# Patient Record
Sex: Female | Born: 1955 | Race: White | Hispanic: No | Marital: Married | Smoking: Current every day smoker
Health system: Southern US, Community
[De-identification: ages and names within clinical notes are randomized; demographics above are authoritative.]

## PROBLEM LIST (undated history)

## (undated) DIAGNOSIS — E119 Type 2 diabetes mellitus without complications: Secondary | ICD-10-CM

## (undated) DIAGNOSIS — Z5189 Encounter for other specified aftercare: Secondary | ICD-10-CM

## (undated) DIAGNOSIS — H269 Unspecified cataract: Secondary | ICD-10-CM

## (undated) DIAGNOSIS — E785 Hyperlipidemia, unspecified: Secondary | ICD-10-CM

## (undated) DIAGNOSIS — Z8049 Family history of malignant neoplasm of other genital organs: Secondary | ICD-10-CM

## (undated) DIAGNOSIS — K922 Gastrointestinal hemorrhage, unspecified: Secondary | ICD-10-CM

## (undated) DIAGNOSIS — M1990 Unspecified osteoarthritis, unspecified site: Secondary | ICD-10-CM

## (undated) DIAGNOSIS — Z803 Family history of malignant neoplasm of breast: Secondary | ICD-10-CM

## (undated) HISTORY — PX: ABDOMINAL HYSTERECTOMY: SHX81

## (undated) HISTORY — PX: DIAGNOSTIC LAPAROSCOPY: SUR761

## (undated) HISTORY — DX: Hyperlipidemia, unspecified: E78.5

## (undated) HISTORY — DX: Family history of malignant neoplasm of breast: Z80.3

## (undated) HISTORY — DX: Unspecified cataract: H26.9

## (undated) HISTORY — PX: COLONOSCOPY: SHX174

## (undated) HISTORY — DX: Family history of malignant neoplasm of other genital organs: Z80.49

## (undated) HISTORY — DX: Gastrointestinal hemorrhage, unspecified: K92.2

## (undated) HISTORY — DX: Encounter for other specified aftercare: Z51.89

## (undated) HISTORY — DX: Unspecified osteoarthritis, unspecified site: M19.90

## (undated) HISTORY — DX: Type 2 diabetes mellitus without complications: E11.9

---
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
## 1977-09-21 HISTORY — PX: TONSILLECTOMY: SUR1361

## 1998-02-21 HISTORY — PX: EYE SURGERY: SHX253

## 2008-06-14 HISTORY — PX: LAPAROSCOPIC SUPRACERVICAL HYSTERECTOMY: SUR797

## 2011-12-22 LAB — HM MAMMOGRAPHY

## 2015-09-13 DIAGNOSIS — K922 Gastrointestinal hemorrhage, unspecified: Secondary | ICD-10-CM

## 2015-09-13 HISTORY — DX: Gastrointestinal hemorrhage, unspecified: K92.2

## 2015-10-03 LAB — HM COLONOSCOPY

## 2015-10-11 DIAGNOSIS — K922 Gastrointestinal hemorrhage, unspecified: Secondary | ICD-10-CM | POA: Insufficient documentation

## 2015-10-11 DIAGNOSIS — E119 Type 2 diabetes mellitus without complications: Secondary | ICD-10-CM

## 2015-10-11 HISTORY — DX: Type 2 diabetes mellitus without complications: E11.9

## 2015-10-11 LAB — CBC AND DIFFERENTIAL
HCT: 31 — AB (ref 36–46)
HEMOGLOBIN: 11 — AB (ref 12.0–16.0)
Platelets: 144 — AB (ref 150–399)
WBC: 6.8

## 2015-10-11 LAB — BASIC METABOLIC PANEL
BUN: 22 — AB (ref 4–21)
Creatinine: 0.7 (ref 0.5–1.1)
GLUCOSE: 160
POTASSIUM: 4.1 (ref 3.4–5.3)
SODIUM: 137 (ref 137–147)

## 2015-10-11 LAB — HEPATIC FUNCTION PANEL
ALT: 21 (ref 7–35)
AST: 24 (ref 13–35)
Alkaline Phosphatase: 61 (ref 25–125)

## 2017-03-23 ENCOUNTER — Ambulatory Visit (INDEPENDENT_AMBULATORY_CARE_PROVIDER_SITE_OTHER): Payer: PRIVATE HEALTH INSURANCE | Admitting: Family Medicine

## 2017-03-23 ENCOUNTER — Encounter: Payer: Self-pay | Admitting: Family Medicine

## 2017-03-23 VITALS — BP 110/76 | HR 76 | Temp 97.8°F | Resp 16 | Ht 65.0 in | Wt 146.0 lb

## 2017-03-23 DIAGNOSIS — Z114 Encounter for screening for human immunodeficiency virus [HIV]: Secondary | ICD-10-CM

## 2017-03-23 DIAGNOSIS — Z7689 Persons encountering health services in other specified circumstances: Secondary | ICD-10-CM

## 2017-03-23 DIAGNOSIS — Z8719 Personal history of other diseases of the digestive system: Secondary | ICD-10-CM

## 2017-03-23 DIAGNOSIS — E119 Type 2 diabetes mellitus without complications: Secondary | ICD-10-CM | POA: Diagnosis not present

## 2017-03-23 DIAGNOSIS — K635 Polyp of colon: Secondary | ICD-10-CM

## 2017-03-23 DIAGNOSIS — E785 Hyperlipidemia, unspecified: Secondary | ICD-10-CM | POA: Insufficient documentation

## 2017-03-23 DIAGNOSIS — Z23 Encounter for immunization: Secondary | ICD-10-CM | POA: Diagnosis not present

## 2017-03-23 DIAGNOSIS — Z720 Tobacco use: Secondary | ICD-10-CM

## 2017-03-23 DIAGNOSIS — Z1159 Encounter for screening for other viral diseases: Secondary | ICD-10-CM

## 2017-03-23 LAB — POCT UA - MICROALBUMIN: Microalbumin Ur, POC: 0 mg/L

## 2017-03-23 NOTE — Assessment & Plan Note (Signed)
Control unknown - last A1c not available Check A1c Continue metformin currently If A1c is >7, would add Jardiance vs Trulicity Foot exam completed today Patient has upcoming eye exam She will check with her insurance about pneumococcal vaccine Flu vaccine given today Follow-up in 3 months

## 2017-03-23 NOTE — Assessment & Plan Note (Signed)
Patient denies any melena, hematochezia, hematemesis currently From patient's description, it sounds as though this was a procedure complication Given her history of colon polyps, she will need repeat colonoscopy in 2020 Check CBC today

## 2017-03-23 NOTE — Progress Notes (Signed)
Patient: Denise Torres Female    DOB: [birth year]   61 y.o.   MRN: [id number] Visit Date: 03/23/2017  Today's Provider: [name], MD   Chief Complaint  Patient presents with  . Establish Care   Subjective:    HPI   Denise Torres presents to establish care. She has a H/O diabetes mellitis type 2, hypercholesterolemia. Her previous PCP was Lonni Fix at [facility], but she has not seen a doctor in over one year due to losing job, Psychologist, clinical, etc. She states she is due for a mammogram (never had abnormal mammogram per pt). She states her last Tdap was given 3 years ago when her grandchild was born. Her last colonoscopy was 10/03/2015, and this is due to be repeated in 2020. She had a partial hysterectomy in 2000, due to fibroid tumors that caused menorrhagia. She has never had an abnormal pap. She believes she still has her cervix.  H/o lower GI bleed: During last screening colonoscopy, had clip placed on larger polyp that fell off.  Had to be admitted for blood transfusions  OA: mostly in b/l small fingers and L knee.  Notes that previous XRays showed degenerative changes in L knee.  Has had cortisone injections in the past in L hand.  T2DM:   Taking Metformin 1000mg  BID, no side effects from medication.  Unsure of last A1c.  Has eye exam scheduled in 2 wks.  Thinks she has never had foot exam or urine microalbumin.  Unsure if she has had pneumococcal vaccines.  HLD: Taking simvastatin 10mg  daily.  Admits that she forgets doses at times.  No side effects  Tobacco abuse: Tried to quit previously by tapering down.  Husband quit smoking.  States that she felt like she was going to have a meltdown when she quit.  Thinks she is 80% ready to quit smoking currently.  It is not affecting her relationship.  She knows about the cancer, lung disease, and CVD risks.   No Known Allergies   Current Outpatient Prescriptions:  .  metFORMIN (GLUCOPHAGE) 500 MG tablet,  Take 1,000 mg by mouth 2 (two) times daily., Disp: , Rfl: 0 .  simvastatin (ZOCOR) 10 MG tablet, Take 10 mg by mouth every evening., Disp: , Rfl: 0  Review of Systems  Constitutional: Negative.   HENT: Negative.   Eyes: Negative.   Respiratory: Negative.   Cardiovascular: Negative.   Gastrointestinal: Negative.   Endocrine: Negative.   Genitourinary: Negative.   Musculoskeletal: Positive for arthralgias. Negative for back pain, gait problem, joint swelling, myalgias, neck pain and neck stiffness.  Skin: Negative.   Allergic/Immunologic: Positive for environmental allergies. Negative for food allergies and immunocompromised state.  Neurological: Positive for headaches. Negative for dizziness, tremors, seizures, syncope, facial asymmetry, speech difficulty, weakness, light-headedness and numbness.  Hematological: Negative.   Psychiatric/Behavioral: Negative.    Past Medical History:  Diagnosis Date  . Arthritis    mostly in b/l hands, left knee  . Blood transfusion without reported diagnosis   . Diabetes mellitus without complication ([place])   . Hyperlipidemia   . Lower GI bleed 09/2015   Past Surgical History:  Procedure Laterality Date  . EYE SURGERY Bilateral 02/21/1998   lasik  . LAPAROSCOPIC SUPRACERVICAL HYSTERECTOMY  2010   partial  . TONSILLECTOMY  09/21/1977  . WISDOM TOOTH EXTRACTION Bilateral 1976   Family History  Problem Relation Age of Onset  . Breast cancer Mother 40  s/p masectomy  . Diabetes Mother   . Atrial fibrillation Mother   . Diabetes Father   . Heart attack Father 48  . Rheum arthritis Brother   . Alcohol abuse Brother   . Drug abuse Brother     Social History  Substance Use Topics  . Smoking status: Current Every Day Smoker    Packs/day: 0.25    Years: 10.00    Types: Cigarettes  . Smokeless tobacco: Never Used     Comment: started smoking around 2008  . Alcohol use No     Comment: rarely   Objective:   BP 110/76 (BP Location:  Right Arm, Patient Position: Sitting, Cuff Size: Large)   Pulse 76   Temp 97.8 F (36.6 C) (Oral)   Resp 16   Ht 5\' 5"  (1.651 m)   Wt 146 lb (66.2 kg)   BMI 24.30 kg/m  Vitals:   03/23/17 1400  BP: 110/76  Pulse: 76  Resp: 16  Temp: 97.8 F (36.6 C)  TempSrc: Oral  Weight: 146 lb (66.2 kg)  Height: 5\' 5"  (1.651 m)     Physical Exam  Constitutional: She is oriented to person, place, and time. She appears well-developed and well-nourished. No distress.  HENT:  Head: Normocephalic and atraumatic.  Right Ear: External ear normal.  Left Ear: External ear normal.  Nose: Nose normal.  Mouth/Throat: Oropharynx is clear and moist.  Eyes: Conjunctivae are normal. No scleral icterus.  Neck: Neck supple. No thyromegaly present.  Cardiovascular: Normal rate, regular rhythm, normal heart sounds and intact distal pulses.   No murmur heard. Pulmonary/Chest: Breath sounds normal. No respiratory distress. She has no wheezes. She has no rales.  Abdominal: Soft. Bowel sounds are normal. She exhibits no distension. There is no tenderness. There is no rebound and no guarding.  Musculoskeletal: She exhibits no edema or deformity.  Lymphadenopathy:    She has no cervical adenopathy.  Neurological: She is alert and oriented to person, place, and time.  Skin: Skin is warm and dry. 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  Psychiatric: She has a normal mood and affect. Her behavior is normal.  Vitals reviewed.   Diabetic Foot Exam - Simple   Simple Foot Form Diabetic Foot exam was performed with the following findings:  Yes 03/23/2017  3:17 PM  Visual Inspection No deformities, no ulcerations, no other skin breakdown bilaterally:  Yes Sensation Testing Intact to touch and monofilament testing bilaterally:  Yes Pulse Check Posterior Tibialis and Dorsalis pulse intact bilaterally:  Yes Comments         Assessment & Plan:        Problem List Items Addressed This Visit      Digestive   Colon polyps    Next colonoscopy to be performed in 2020 for screening given history of polyps        Endocrine   Diabetes mellitus type 2, controlled, without complications ([place])    Control unknown - last A1c not available Check A1c Continue metformin currently If A1c is >7, would add Jardiance vs Trulicity Foot exam completed today Patient has upcoming eye exam She will check with her insurance about pneumococcal vaccine Flu vaccine given today Follow-up in 3 months      Relevant Medications   metFORMIN (GLUCOPHAGE) 500 MG tablet   simvastatin (ZOCOR) 10 MG tablet   Other Relevant Orders   Hemoglobin A1c   POCT UA - Microalbumin (Completed)  Other   H/O: GI bleed    Patient denies any melena, hematochezia, hematemesis currently From patient's description, it sounds as though this was a procedure complication Given her history of colon polyps, she will need repeat colonoscopy in 2020 Check CBC today      Relevant Orders   CBC w/Diff/Platelet   HLD (hyperlipidemia)    Control unknown, last lipid panel not available Continue simvastatin currently Recheck lipid panel      Relevant Medications   simvastatin (ZOCOR) 10 MG tablet   Other Relevant Orders   COMPLETE METABOLIC PANEL WITH GFR   Lipid panel   Tobacco abuse    Long discussion with the patient regarding risks of smoking She states that she is not quite ready to fully quit at this time She may be interested in medications to help her quit in the future, but not currently She states she may be willing to try to cut back on the number of cigarettes she smokes a day she has done this in the past       Other Visit Diagnoses    Encounter to establish care    -  Primary   Encounter for screening for HIV       Relevant Orders   HIV antibody (with reflex)   Need for hepatitis C screening test       Relevant Orders   Hepatitis C Antibody   Need  for immunization against influenza       Relevant Orders   Flu Vaccine QUAD 36+ mos IM (Completed)       Return in about 2 months (around 05/23/2017) for CPE.   Addressed extensive list of chronic and acute medical problems today requiring extensive time in counseling and coordination of care.  Over half of this 45 minute visit were spent in counseling and coordinating care of multiple medical problems.      The entirety of the information documented in the History of Present Illness, Review of Systems and Physical Exam were personally obtained by me. Portions of this information were initially documented by Raquel Sarna Ratchford, CMA and reviewed by me for thoroughness and accuracy.     Denise Paganini, MD  [facility]

## 2017-03-23 NOTE — Patient Instructions (Addendum)
  Diet Recommendations for Diabetes   Starchy (carb) foods include: Bread, rice, pasta, potatoes, corn, crackers, bagels, muffins, all baked goods.  (Fruits, milk, and yogurt also have carbohydrate, but most of these foods will not spike your blood sugar as the starchy foods will.)  A few fruits do cause high blood sugars; use small portions of bananas (limit to 1/2 at a time), grapes, watermelon, and most tropical fruits.    Protein foods include: Meat, fish, poultry, eggs, dairy foods, and beans such as pinto and kidney beans (beans also provide carbohydrate).   1. Eat at least 3 meals and 1-2 snacks per day. Never go more than 4-5 hours while awake without eating. Eat breakfast within the first hour of getting up.   2. Limit starchy foods to TWO per meal and ONE per snack. ONE portion of a starchy  food is equal to the following:   - ONE slice of bread (or its equivalent, such as half of a hamburger bun).   - 1/2 cup of a "scoopable" starchy food such as potatoes or rice.   - 15 grams of carbohydrate as shown on food label.  3. Include at every meal: a protein food, a carb food, and vegetables and/or fruit.   - Obtain twice as many veg's as protein or carbohydrate foods for both lunch and dinner.   - Fresh or frozen veg's are best.   - Try to keep frozen veg's on hand for a quick vegetable serving.      Check with insurance about Shingrix (shingles vaccination) and pneumococcal vaccines

## 2017-03-23 NOTE — Assessment & Plan Note (Signed)
Next colonoscopy to be performed in 2020 for screening given history of polyps

## 2017-03-23 NOTE — Assessment & Plan Note (Signed)
Long discussion with the patient regarding risks of smoking She states that she is not quite ready to fully quit at this time She may be interested in medications to help her quit in the future, but not currently She states she may be willing to try to cut back on the number of cigarettes she smokes a day she has done this in the past

## 2017-03-23 NOTE — Assessment & Plan Note (Signed)
Control unknown, last lipid panel not available Continue simvastatin currently Recheck lipid panel

## 2017-03-24 ENCOUNTER — Telehealth: Payer: Self-pay

## 2017-03-24 LAB — COMPLETE METABOLIC PANEL WITH GFR
AG Ratio: 1.9 (calc) (ref 1.0–2.5)
ALBUMIN MSPROF: 4.5 g/dL (ref 3.6–5.1)
ALKALINE PHOSPHATASE (APISO): 82 U/L (ref 33–130)
ALT: 33 U/L — ABNORMAL HIGH (ref 6–29)
AST: 22 U/L (ref 10–35)
BUN: 12 mg/dL (ref 7–25)
CO2: 28 mmol/L (ref 20–32)
CREATININE: 0.9 mg/dL (ref 0.50–1.05)
Calcium: 9.2 mg/dL (ref 8.6–10.4)
Chloride: 104 mmol/L (ref 98–110)
GFR, Est African American: 81 mL/min/{1.73_m2} (ref >=60)
GFR, Est Non African American: 70 mL/min/{1.73_m2} (ref >=60)
GLUCOSE: 123 mg/dL (ref 65–139)
Globulin: 2.4 g/dL (calc) (ref 1.9–3.7)
Potassium: 3.8 mmol/L (ref 3.5–5.3)
Sodium: 141 mmol/L (ref 135–146)
Total Bilirubin: 0.5 mg/dL (ref 0.2–1.2)
Total Protein: 6.9 g/dL (ref 6.1–8.1)

## 2017-03-24 LAB — CBC WITH DIFFERENTIAL/PLATELET
BASOS ABS: 42 {cells}/uL (ref 0–200)
Basophils Relative: 0.8 %
EOS ABS: 101 {cells}/uL (ref 15–500)
Eosinophils Relative: 1.9 %
HEMATOCRIT: 39.9 % (ref 35.0–45.0)
Hemoglobin: 14 g/dL (ref 11.7–15.5)
LYMPHS ABS: 1887 {cells}/uL (ref 850–3900)
MCH: 31.6 pg (ref 27.0–33.0)
MCHC: 35.1 g/dL (ref 32.0–36.0)
MCV: 90.1 fL (ref 80.0–100.0)
MPV: 10.2 fL (ref 7.5–12.5)
Monocytes Relative: 8 %
NEUTROS PCT: 53.7 %
Neutro Abs: 2846 cells/uL (ref 1500–7800)
Platelets: 202 10*3/uL (ref 140–400)
RBC: 4.43 10*6/uL (ref 3.80–5.10)
RDW: 12.1 % (ref 11.0–15.0)
TOTAL LYMPHOCYTE: 35.6 %
WBC: 5.3 10*3/uL (ref 3.8–10.8)
WBCMIX: 424 {cells}/uL (ref 200–950)

## 2017-03-24 LAB — HIV ANTIBODY (ROUTINE TESTING W REFLEX): HIV 1&2 Ab, 4th Generation: NONREACTIVE

## 2017-03-24 LAB — LIPID PANEL
CHOL/HDL RATIO: 3.4 (calc) (ref <5.0)
CHOLESTEROL: 225 mg/dL — AB (ref <200)
HDL: 66 mg/dL (ref >50)
LDL Cholesterol (Calc): 133 mg/dL (calc) — ABNORMAL HIGH
Non-HDL Cholesterol (Calc): 159 mg/dL (calc) — ABNORMAL HIGH (ref <130)
Triglycerides: 144 mg/dL (ref <150)

## 2017-03-24 LAB — HEMOGLOBIN A1C
HEMOGLOBIN A1C: 6.8 %{Hb} — AB (ref <5.7)
Mean Plasma Glucose: 148 (calc)
eAG (mmol/L): 8.2 (calc)

## 2017-03-24 LAB — HEPATITIS C ANTIBODY
Hepatitis C Ab: NONREACTIVE
SIGNAL TO CUT-OFF: 0.03 (ref <1.00)

## 2017-03-24 NOTE — Telephone Encounter (Signed)
-----   Message from Virginia Crews, MD sent at 03/24/2017  2:29 PM EDT ----- No protein in the urine. A1c is well controlled at 6.8. Goal is less than 7. Continue metformin at current dose. Negative HIV and hepatitis C screenings. Normal kidney function and electrolytes. ALT, a liver enzyme, is very slightly elevated. We'll plan to recheck it next time we get labs. Normal blood counts. Cholesterol is not well controlled on current simvastatin dose. Let's increase simvastatin to 20 mg daily. We will send a new prescription for simvastatin 20 mg daily #90, 3 refills. We can recheck bad cholesterol in about 3 months to see if it has improved.  Virginia Crews, MD, MPH [facility] 03/24/2017 2:29 PM

## 2017-03-24 NOTE — Telephone Encounter (Signed)
Lmtcb.

## 2017-03-28 ENCOUNTER — Encounter: Payer: Self-pay | Admitting: Family Medicine

## 2017-03-29 NOTE — Telephone Encounter (Signed)
lmtcb-Anastasiya V Hopkins, RMA  

## 2017-03-30 ENCOUNTER — Encounter: Payer: Self-pay | Admitting: Family Medicine

## 2017-03-30 NOTE — Telephone Encounter (Signed)
Pt saw results on My Chart. 

## 2017-03-30 NOTE — Progress Notes (Signed)
Medical records received from previous PCP at [facility] family [facility]. Records were reviewed by myself and important details are below.  -DEXA scan completed in 05/2015, but no results available  -Patient with history of elevated LFTs. Abdominal ultrasound in 05/2015 normal with no ascites, normal appearance of liver and gallbladder.  Health maintenance items obstructed and chart as well as recent labs. Important data scanned to chart as well  Bacigalupo, Dionne Bucy, MD, MPH [facility] 03/30/2017 9:42 AM

## 2017-03-30 NOTE — Progress Notes (Signed)
abstraction

## 2017-03-31 ENCOUNTER — Telehealth: Payer: Self-pay | Admitting: Family Medicine

## 2017-03-31 NOTE — Telephone Encounter (Signed)
ROI ([facility]) faxed to [facility] for records.

## 2017-04-06 LAB — HM DIABETES EYE EXAM

## 2017-04-07 ENCOUNTER — Encounter: Payer: Self-pay | Admitting: Family Medicine

## 2017-04-07 NOTE — Progress Notes (Signed)
error 

## 2017-04-08 ENCOUNTER — Encounter: Payer: Self-pay | Admitting: Family Medicine

## 2017-05-16 ENCOUNTER — Encounter: Payer: Self-pay | Admitting: Family Medicine

## 2017-05-16 MED ORDER — METFORMIN HCL 500 MG PO TABS: 1000.0000 mg | ORAL_TABLET | Freq: Two times a day (BID) | ORAL | 2 refills | Status: DC

## 2017-05-16 MED ORDER — SIMVASTATIN 10 MG PO TABS: 10.0000 mg | ORAL_TABLET | Freq: Every evening | ORAL | 2 refills | Status: DC

## 2017-05-23 ENCOUNTER — Encounter: Payer: Self-pay | Admitting: Family Medicine

## 2017-07-06 ENCOUNTER — Telehealth: Payer: Self-pay | Admitting: Family Medicine

## 2017-07-06 NOTE — Telephone Encounter (Signed)
Received letter from [employer] regarding medical history for patient.  They state they received a claim and want a list of all dates, diagnoses, medications prescribed for patient from 03/14/16 to 03/13/17 and a name of all other physicians that treated patient.  We need to see if patient wants this information released.  She will also likely need to sign ROI stating she wants this released.  Also we will need to be able to fax forms or give to patient to upload.  Is not secure for [place] to upload medical records to a website.  Virginia Crews, MD, MPH [facility] 07/06/2017 3:55 PM

## 2017-07-12 NOTE — Telephone Encounter (Signed)
LMTCB. Pt has OV tomorrow. Will discuss further at [place].

## 2017-07-13 ENCOUNTER — Encounter: Payer: Self-pay | Admitting: Family Medicine

## 2017-07-13 ENCOUNTER — Ambulatory Visit: Payer: PRIVATE HEALTH INSURANCE | Admitting: Family Medicine

## 2017-07-13 VITALS — BP 136/82 | HR 70 | Temp 97.5°F | Resp 16 | Ht 65.0 in | Wt 145.0 lb

## 2017-07-13 DIAGNOSIS — Z0000 Encounter for general adult medical examination without abnormal findings: Secondary | ICD-10-CM | POA: Diagnosis not present

## 2017-07-13 DIAGNOSIS — Z720 Tobacco use: Secondary | ICD-10-CM | POA: Diagnosis not present

## 2017-07-13 DIAGNOSIS — Z1231 Encounter for screening mammogram for malignant neoplasm of breast: Secondary | ICD-10-CM | POA: Diagnosis not present

## 2017-07-13 DIAGNOSIS — Z1239 Encounter for other screening for malignant neoplasm of breast: Secondary | ICD-10-CM

## 2017-07-13 DIAGNOSIS — K635 Polyp of colon: Secondary | ICD-10-CM

## 2017-07-13 NOTE — Assessment & Plan Note (Signed)
Last colonoscopy in 2017 showed sessile serrated adenomas Next repeat in 09/2018

## 2017-07-13 NOTE — Assessment & Plan Note (Signed)
3-5 min discussion with patient regarding risks of smoking  She is not ready to quit fully at this time She is trying to cut back slowly Not currently interested in medications to help her quit Her husband has quit smoking and she has a new grandchild on the way that make.

## 2017-07-13 NOTE — Telephone Encounter (Signed)
Pt returned call. Thanks TNP °

## 2017-07-13 NOTE — Progress Notes (Signed)
Patient: Denise Torres, Female    DOB: May 03, 1956, 62 y.o.   MRN: [id number] Visit Date: 07/13/2017  Today's Provider: [name], MD   I, Martha Clan, CMA, am acting as scribe for [name], MD.  Chief Complaint  Patient presents with  . Annual Exam   Subjective:    Annual physical exam Denise Torres is a 62 y.o. female who presents today for health maintenance and complete physical. She feels well. She reports exercising irregularly, but has joined a gym and plans to start exercising regularly. She reports she is sleeping well.  Last mammogram- 12/22/2011- BI-RADS 2. Pt states she believes this was done in 2017. Last colonoscopy- 10/03/2015- sessile serrated adenomas. Repeat 3 years. Last pap- not on file, but pt believes this was done in spring of 2017. S/p total hysterectomy with cervix removed for fibroids.  States no injections/vaccines are covered by current insurance.  Cannot afford pneumococcal vaccine today.  Hot flashes seem to have improved. -----------------------------------------------------------------   Review of Systems  Constitutional: Negative.   HENT: Negative.   Eyes: Negative.   Respiratory: Negative.   Cardiovascular: Negative.   Gastrointestinal: Negative.   Endocrine: Negative.   Genitourinary: Negative.   Musculoskeletal: Positive for arthralgias. Negative for back pain, gait problem, joint swelling, myalgias, neck pain and neck stiffness.  Skin: Negative.   Allergic/Immunologic: Negative.   Neurological: Negative.   Hematological: Negative.   Psychiatric/Behavioral: Negative.     Social History      She  reports that she has been smoking cigarettes.  She has a 5.00 pack-year smoking history. she has never used smokeless tobacco. She reports that she does not drink alcohol or use drugs.       Social History   Socioeconomic History  . Marital status: Married    Spouse name: Herbie Baltimore  . Number of children: 2  .  Years of education: 16  . Highest education level: Bachelor's degree (e.g., BA, AB, BS)  Social Needs  . Financial resource strain: Not hard at all  . Food insecurity - worry: Never true  . Food insecurity - inability: Never true  . Transportation needs - medical: No  . Transportation needs - non-medical: No  Occupational History    Employer: [employer]  Tobacco Use  . Smoking status: Current Every Day Smoker    Packs/day: 0.50    Years: 10.00    Pack years: 5.00    Types: Cigarettes  . Smokeless tobacco: Never Used  . Tobacco comment: started smoking around 2008  Substance and Sexual Activity  . Alcohol use: No    Comment: rarely  . Drug use: No  . Sexual activity: Yes  Other Topics Concern  . None  Social History Narrative  . None    Past Medical History:  Diagnosis Date  . Arthritis    mostly in b/l hands, left knee  . Blood transfusion without reported diagnosis   . Diabetes mellitus without complication ([place])   . Hyperlipidemia   . Lower GI bleed 09/2015     Patient Active Problem List   Diagnosis Date Noted  . H/O: GI bleed 03/23/2017  . HLD (hyperlipidemia) 03/23/2017  . Tobacco abuse 03/23/2017  . Colon polyps 03/23/2017  . Diabetes mellitus type 2, controlled, without complications ([place]) 12/12/1599    Past Surgical History:  Procedure Laterality Date  . EYE SURGERY Bilateral 02/21/1998   lasik  . LAPAROSCOPIC SUPRACERVICAL HYSTERECTOMY  2010   partial  .  TONSILLECTOMY  09/21/1977  . WISDOM TOOTH EXTRACTION Bilateral 1976    Family History        Family Status  Relation Name Status  . Mother  Alive  . Father  Alive  . Brother  Alive  . Brother  Deceased        Her family history includes Alcohol abuse in her brother; Atrial fibrillation in her mother; Breast cancer (age of onset: 63) in her mother; Diabetes in her father and mother; Drug abuse in her brother; Heart attack (age of onset: 75) in her father; Rheum arthritis in her  brother; Stroke in her father.     No Known Allergies   Current Outpatient Medications:  .  metFORMIN (GLUCOPHAGE) 500 MG tablet, Take 2 tablets (1,000 mg total) by mouth 2 (two) times daily., Disp: 180 tablet, Rfl: 2 .  simvastatin (ZOCOR) 10 MG tablet, Take 1 tablet (10 mg total) by mouth every evening., Disp: 90 tablet, Rfl: 2   Patient Care Team: Virginia Crews, MD as PCP - General (Family Medicine)      Objective:   Vitals: BP 136/82 (BP Location: Left Arm, Patient Position: Sitting, Cuff Size: Normal)   Pulse 70   Temp (!) 97.5 F (36.4 C) (Oral)   Resp 16   Ht 5\' 5"  (1.651 m)   Wt 145 lb (65.8 kg)   SpO2 99%   BMI 24.13 kg/m    Vitals:   07/13/17 1418  BP: 136/82  Pulse: 70  Resp: 16  Temp: (!) 97.5 F (36.4 C)  TempSrc: Oral  SpO2: 99%  Weight: 145 lb (65.8 kg)  Height: 5\' 5"  (1.651 m)     Physical Exam  Constitutional: She is oriented to person, place, and time. She appears well-developed and well-nourished. No distress.  HENT:  Head: Normocephalic and atraumatic.  Right Ear: External ear normal.  Left Ear: External ear normal.  Nose: Nose normal.  Mouth/Throat: Oropharynx is clear and moist.  Eyes: Conjunctivae and EOM are normal. Pupils are equal, round, and reactive to light. No scleral icterus.  Neck: Neck supple. No thyromegaly present.  Cardiovascular: Normal rate, regular rhythm, normal heart sounds and intact distal pulses.  No murmur heard. Pulmonary/Chest: Breath sounds normal. No respiratory distress. She has no wheezes. She has no rales.  Abdominal: Soft. Bowel sounds are normal. She exhibits no distension. There is no tenderness. There is no rebound and no guarding.  Genitourinary:  Genitourinary Comments: Breasts: breasts appear normal, no suspicious masses, no skin or nipple changes or axillary nodes. GYN: bimanual exam reveals vaginal cuff with no cervix or uterus. No adenexal masses  Musculoskeletal: She exhibits no edema or  deformity.  Lymphadenopathy:    She has no cervical adenopathy.  Neurological: She is alert and oriented to person, place, and time.  Skin: Skin is warm and dry. No rash noted.  Psychiatric: She has a normal mood and affect. Her behavior is normal.  Vitals reviewed.    Depression Screen PHQ 2/9 Scores 07/13/2017 03/23/2017  PHQ - 2 Score 0 0      Assessment & Plan:     Routine Health Maintenance and Physical Exam  Exercise Activities and Dietary recommendations Goals    None      Immunization History  Administered Date(s) Administered  . Influenza,inj,Quad PF,6+ Mos 03/23/2017  . Tdap 09/20/2009    Health Maintenance  Topic Date Due  . PNEUMOCOCCAL POLYSACCHARIDE VACCINE (1) 03/03/1960  . MAMMOGRAM  12/21/2013  . HEMOGLOBIN A1C  09/21/2017  . FOOT EXAM  03/23/2018  . URINE MICROALBUMIN  03/23/2018  . OPHTHALMOLOGY EXAM  04/06/2018  . COLONOSCOPY  10/03/2018  . TETANUS/TDAP  09/21/2019  . INFLUENZA VACCINE  Completed  . Hepatitis C Screening  Completed  . HIV Screening  Completed     Discussed health benefits of physical activity, and encouraged her to engage in regular exercise appropriate for her age and condition.    -------------------------------------------------------------------- Problem List Items Addressed This Visit      Digestive   Colon polyps    Last colonoscopy in 2017 showed sessile serrated adenomas Next repeat in 09/2018        Other   Tobacco abuse    3-5 min discussion with patient regarding risks of smoking  She is not ready to quit fully at this time She is trying to cut back slowly Not currently interested in medications to help her quit Her husband has quit smoking and she has a new grandchild on the way that make.       Other Visit Diagnoses    Encounter for annual physical exam    -  Primary   Breast cancer screening       Relevant Orders   MM SCREENING BREAST TOMO BILATERAL      Return in about 6 months (around  01/10/2018) for diabetes follow-up.   The entirety of the information documented in the History of Present Illness, Review of Systems and Physical Exam were personally obtained by me. Portions of this information were initially documented by Raquel Sarna Ratchford, CMA and reviewed by me for thoroughness and accuracy.    Virginia Crews, MD, MPH [facility] 07/13/2017 3:25 PM

## 2017-07-13 NOTE — Patient Instructions (Signed)
Preventive Care 40-64 Years, Female Preventive care refers to lifestyle choices and visits with your health care provider that can promote health and wellness. What does preventive care include?  A yearly physical exam. This is also called an annual well check.  Dental exams once or twice a year.  Routine eye exams. Ask your health care provider how often you should have your eyes checked.  Personal lifestyle choices, including: ? Daily care of your teeth and gums. ? Regular physical activity. ? Eating a healthy diet. ? Avoiding tobacco and drug use. ? Limiting alcohol use. ? Practicing safe sex. ? Taking low-dose aspirin daily starting at age 58. ? Taking vitamin and mineral supplements as recommended by your health care provider. What happens during an annual well check? The services and screenings done by your health care provider during your annual well check will depend on your age, overall health, lifestyle risk factors, and family history of disease. Counseling Your health care provider may ask you questions about your:  Alcohol use.  Tobacco use.  Drug use.  Emotional well-being.  Home and relationship well-being.  Sexual activity.  Eating habits.  Work and work Statistician.  Method of birth control.  Menstrual cycle.  Pregnancy history.  Screening You may have the following tests or measurements:  Height, weight, and BMI.  Blood pressure.  Lipid and cholesterol levels. These may be checked every 5 years, or more frequently if you are over 81 years old.  Skin check.  Lung cancer screening. You may have this screening every year starting at age 78 if you have a 30-pack-year history of smoking and currently smoke or have quit within the past 15 years.  Fecal occult blood test (FOBT) of the stool. You may have this test every year starting at age 65.  Flexible sigmoidoscopy or colonoscopy. You may have a sigmoidoscopy every 5 years or a colonoscopy  every 10 years starting at age 30.  Hepatitis C blood test.  Hepatitis B blood test.  Sexually transmitted disease (STD) testing.  Diabetes screening. This is done by checking your blood sugar (glucose) after you have not eaten for a while (fasting). You may have this done every 1-3 years.  Mammogram. This may be done every 1-2 years. Talk to your health care provider about when you should start having regular mammograms. This may depend on whether you have a family history of breast cancer.  BRCA-related cancer screening. This may be done if you have a family history of breast, ovarian, tubal, or peritoneal cancers.  Pelvic exam and Pap test. This may be done every 3 years starting at age 80. Starting at age 36, this may be done every 5 years if you have a Pap test in combination with an HPV test.  Bone density scan. This is done to screen for osteoporosis. You may have this scan if you are at high risk for osteoporosis.  Discuss your test results, treatment options, and if necessary, the need for more tests with your health care provider. Vaccines Your health care provider may recommend certain vaccines, such as:  Influenza vaccine. This is recommended every year.  Tetanus, diphtheria, and acellular pertussis (Tdap, Td) vaccine. You may need a Td booster every 10 years.  Varicella vaccine. You may need this if you have not been vaccinated.  Zoster vaccine. You may need this after age 5.  Measles, mumps, and rubella (MMR) vaccine. You may need at least one dose of MMR if you were born in  1955 or later. You may also need a second dose.  Pneumococcal 13-valent conjugate (PCV13) vaccine. You may need this if you have certain conditions and were not previously vaccinated.  Pneumococcal polysaccharide (PPSV23) vaccine. You may need one or two doses if you smoke cigarettes or if you have certain conditions.  Meningococcal vaccine. You may need this if you have certain  conditions.  Hepatitis A vaccine. You may need this if you have certain conditions or if you travel or work in places where you may be exposed to hepatitis A.  Hepatitis B vaccine. You may need this if you have certain conditions or if you travel or work in places where you may be exposed to hepatitis B.  Haemophilus influenzae type b (Hib) vaccine. You may need this if you have certain conditions.  Talk to your health care provider about which screenings and vaccines you need and how often you need them. This information is not intended to replace advice given to you by your health care provider. Make sure you discuss any questions you have with your health care provider. Document Released: 06/27/2015 Document Revised: 02/18/2016 Document Reviewed: 04/01/2015 Elsevier Interactive Patient Education  2018 Elsevier Inc.  

## 2017-07-13 NOTE — Telephone Encounter (Signed)
Pt is unsure of who would want this information. Advised pt we will investigate further at today's OV.

## 2018-01-20 ENCOUNTER — Encounter: Payer: Self-pay | Admitting: Family Medicine

## 2018-01-20 MED ORDER — METFORMIN HCL 500 MG PO TABS: 1000.0000 mg | ORAL_TABLET | Freq: Two times a day (BID) | ORAL | 0 refills | Status: DC

## 2018-04-19 ENCOUNTER — Encounter: Payer: Self-pay | Admitting: Family Medicine

## 2018-04-19 ENCOUNTER — Ambulatory Visit (INDEPENDENT_AMBULATORY_CARE_PROVIDER_SITE_OTHER): Payer: BLUE CROSS/BLUE SHIELD | Admitting: Family Medicine

## 2018-04-19 VITALS — BP 154/81 | HR 72 | Temp 98.2°F | Wt 146.0 lb

## 2018-04-19 DIAGNOSIS — R030 Elevated blood-pressure reading, without diagnosis of hypertension: Secondary | ICD-10-CM

## 2018-04-19 DIAGNOSIS — E785 Hyperlipidemia, unspecified: Secondary | ICD-10-CM | POA: Diagnosis not present

## 2018-04-19 DIAGNOSIS — E119 Type 2 diabetes mellitus without complications: Secondary | ICD-10-CM | POA: Diagnosis not present

## 2018-04-19 DIAGNOSIS — Z23 Encounter for immunization: Secondary | ICD-10-CM | POA: Diagnosis not present

## 2018-04-19 LAB — POCT GLYCOSYLATED HEMOGLOBIN (HGB A1C): HEMOGLOBIN A1C: 7.5 % — AB (ref 4.0–5.6)

## 2018-04-19 LAB — POCT UA - MICROALBUMIN: Microalbumin Ur, POC: 20 mg/L

## 2018-04-19 MED ORDER — METFORMIN HCL 500 MG PO TABS: 1000.0000 mg | ORAL_TABLET | Freq: Two times a day (BID) | ORAL | 6 refills | Status: DC

## 2018-04-19 MED ORDER — SIMVASTATIN 10 MG PO TABS: 10.0000 mg | ORAL_TABLET | Freq: Every evening | ORAL | 2 refills | Status: DC

## 2018-04-19 NOTE — Assessment & Plan Note (Signed)
Continue Simvastatin at current dose Recheck CMP and FLP Goal LDL <70 given DM

## 2018-04-19 NOTE — Assessment & Plan Note (Signed)
Uncontrolled A1c is elevated from 6.8-7.5 She has been out of her medication for a few months, so we will resume this Discussed importance of low-carb diet and regular exercise Foot exam completed today Microalbumin collected today and it is normal Needs to schedule an eye exam Pneumococcal vaccination given today Follow-up in 3 months and recheck A1c

## 2018-04-19 NOTE — Assessment & Plan Note (Addendum)
Has been taking a decongestant and believes that is why BP is elevated today She will come in for CMA BP recheck next week after stopping decongestants and we will f/u in 3 months Discussed home monitoring

## 2018-04-19 NOTE — Progress Notes (Signed)
Patient: Denise Torres Female    DOB: 08-Mar-1956   62 y.o.   MRN: [id number] Visit Date: 04/19/2018  Today's Provider: [name], MD   Chief Complaint  Patient presents with  . Diabetes   Subjective:    HPI  Diabetes Mellitus Type II, Follow-up:   Lab Results  Component Value Date   HGBA1C 7.5 (A) 04/19/2018   HGBA1C 6.8 (H) 03/23/2017   Last seen for diabetes 10 months ago.  Management since then includes no change, continue medications. She reports good compliance with treatment, but has been out of the metformin for a few months. She is not having side effects.  Current symptoms include none Home blood sugar records: patient is not cecking FBS at home  Episodes of hypoglycemia? no   Current Insulin Regimen: none Most Recent Eye Exam: 04/06/2017 Weight trend: stable Current diet: in general, an "unhealthy" diet, finished off a bunch of halloween candy recently Current exercise: none  ------------------------------------------------------------------------   Lipid/Cholesterol, Follow-up:   Last seen for this 10 months ago.  Management since that visit includes continue medication.  Last Lipid Panel:    Component Value Date/Time   CHOL 225 (H) 03/23/2017 1540   TRIG 144 03/23/2017 1540   HDL 66 03/23/2017 1540   CHOLHDL 3.4 03/23/2017 1540   LDLCALC 133 (H) 03/23/2017 1540    She reports good compliance with treatment. She is not having side effects.   Wt Readings from Last 3 Encounters:  04/19/18 146 lb (66.2 kg)  07/13/17 145 lb (65.8 kg)  03/23/17 146 lb (66.2 kg)    ------------------------------------------------------------------------  No Known Allergies   Current Outpatient Medications:  .  simvastatin (ZOCOR) 10 MG tablet, Take 1 tablet (10 mg total) by mouth every evening., Disp: 90 tablet, Rfl: 2 .  metFORMIN (GLUCOPHAGE) 500 MG tablet, Take 2 tablets (1,000 mg total) by mouth 2 (two) times daily., Disp: 120 tablet, Rfl:  6  Review of Systems  Constitutional: Negative.   Respiratory: Negative.   Cardiovascular: Negative.   Gastrointestinal: Negative.   Endocrine: Negative.   Musculoskeletal: Negative.     Social History   Tobacco Use  . Smoking status: Current Every Day Smoker    Packs/day: 0.50    Years: 10.00    Pack years: 5.00    Types: Cigarettes  . Smokeless tobacco: Never Used  . Tobacco comment: started smoking around 2008  Substance Use Topics  . Alcohol use: No    Comment: rarely   Objective:   BP (!) 154/81 (BP Location: Left Arm, Patient Position: Sitting, Cuff Size: Normal)   Pulse 72   Temp 98.2 F (36.8 C) (Oral)   Wt 146 lb (66.2 kg)   SpO2 98%   BMI 24.30 kg/m  Vitals:   04/19/18 1324  BP: (!) 154/81  Pulse: 72  Temp: 98.2 F (36.8 C)  TempSrc: Oral  SpO2: 98%  Weight: 146 lb (66.2 kg)     Physical Exam  Constitutional: She is oriented to person, place, and time. She appears well-developed and well-nourished. No distress.  HENT:  Head: Normocephalic and atraumatic.  Right Ear: External ear normal.  Left Ear: External ear normal.  Nose: Nose normal.  Mouth/Throat: Oropharynx is clear and moist.  Eyes: Pupils are equal, round, and reactive to light. Conjunctivae and EOM are normal. No scleral icterus.  Neck: Neck supple. No thyromegaly present.  Cardiovascular: Normal rate, regular rhythm, normal heart sounds and intact distal pulses.  No  murmur heard. Pulmonary/Chest: Effort normal and breath sounds normal. No respiratory distress. She has no wheezes. She has no rales.  Abdominal: Soft. Bowel sounds are normal. She exhibits no distension. There is no tenderness. There is no rebound and no guarding.  Musculoskeletal: She exhibits no edema or deformity.  Lymphadenopathy:    She has no cervical adenopathy.  Neurological: She is alert and oriented to person, place, and time.  Skin: Skin is warm and dry. Capillary refill takes less than 2 seconds. No rash  noted.  Psychiatric: She has a normal mood and affect. Her behavior is normal.  Vitals reviewed.    Diabetic Foot Exam - Simple   Simple Foot Form Diabetic Foot exam was performed with the following findings:  Yes 04/19/2018  2:38 PM  Visual Inspection No deformities, no ulcerations, no other skin breakdown bilaterally:  Yes Sensation Testing Intact to touch and monofilament testing bilaterally:  Yes Pulse Check Posterior Tibialis and Dorsalis pulse intact bilaterally:  Yes Comments      Results for orders placed or performed in visit on 04/19/18  POCT HgB A1C  Result Value Ref Range   Hemoglobin A1C 7.5 (A) 4.0 - 5.6 %   HbA1c POC (<> result, manual entry)     HbA1c, POC (prediabetic range)     HbA1c, POC (controlled diabetic range)    POCT UA - Microalbumin  Result Value Ref Range   Microalbumin Ur, POC 20 mg/L   Creatinine, POC NA mg/dL   Albumin/Creatinine Ratio, Urine, POC NA        Assessment & Plan:   Problem List Items Addressed This Visit      Endocrine   Diabetes mellitus type 2, controlled, without complications ([place]) - Primary    Uncontrolled A1c is elevated from 6.8-7.5 She has been out of her medication for a few months, so we will resume this Discussed importance of low-carb diet and regular exercise Foot exam completed today Microalbumin collected today and it is normal Needs to schedule an eye exam Pneumococcal vaccination given today Follow-up in 3 months and recheck A1c      Relevant Medications   metFORMIN (GLUCOPHAGE) 500 MG tablet   simvastatin (ZOCOR) 10 MG tablet   Other Relevant Orders   POCT HgB A1C (Completed)   POCT UA - Microalbumin (Completed)   Lipid panel   Comprehensive metabolic panel     Other   HLD (hyperlipidemia)    Continue Simvastatin at current dose Recheck CMP and FLP Goal LDL <70 given DM      Relevant Medications   simvastatin (ZOCOR) 10 MG tablet   Other Relevant Orders   Lipid panel   Comprehensive  metabolic panel   Elevated BP without diagnosis of hypertension    Has been taking a decongestant and believes that is why BP is elevated today She will come in for CMA BP recheck next week after stopping decongestants and we will f/u in 3 months Discussed home monitoring          Return in about 3 months (around 07/20/2018) for CPE.   The entirety of the information documented in the History of Present Illness, Review of Systems and Physical Exam were personally obtained by me. Portions of this information were initially documented by Tiburcio Pea, CMA and reviewed by me for thoroughness and accuracy.    Virginia Crews, MD, MPH [facility] 04/19/2018 2:38 PM

## 2018-04-19 NOTE — Patient Instructions (Addendum)
   The CDC recommends two doses of Shingrix (the shingles vaccine) separated by 2 to 6 months for adults age 60 years and older. I recommend checking with your insurance plan regarding coverage for this vaccine.     Schedule an eye exam and mammogram    Diet Recommendations for Diabetes   Starchy (carb) foods include: Bread, rice, pasta, potatoes, corn, crackers, bagels, muffins, all baked goods.  (Fruits, milk, and yogurt also have carbohydrate, but most of these foods will not spike your blood sugar as the starchy foods will.)  A few fruits do cause high blood sugars; use small portions of bananas (limit to 1/2 at a time), grapes, watermelon, and most tropical fruits.    Protein foods include: Meat, fish, poultry, eggs, dairy foods, and beans such as pinto and kidney beans (beans also provide carbohydrate).   1. Eat at least 3 meals and 1-2 snacks per day. Never go more than 4-5 hours while awake without eating. Eat breakfast within the first hour of getting up.   2. Limit starchy foods to TWO per meal and ONE per snack. ONE portion of a starchy  food is equal to the following:   - ONE slice of bread (or its equivalent, such as half of a hamburger bun).   - 1/2 cup of a "scoopable" starchy food such as potatoes or rice.   - 15 grams of carbohydrate as shown on food label.  3. Include at every meal: a protein food, a carb food, and vegetables and/or fruit.   - Obtain twice as many veg's as protein or carbohydrate foods for both lunch and dinner.   - Fresh or frozen veg's are best.   - Try to keep frozen veg's on hand for a quick vegetable serving.

## 2018-06-08 DIAGNOSIS — E785 Hyperlipidemia, unspecified: Secondary | ICD-10-CM | POA: Diagnosis not present

## 2018-06-08 DIAGNOSIS — E119 Type 2 diabetes mellitus without complications: Secondary | ICD-10-CM | POA: Diagnosis not present

## 2018-06-09 ENCOUNTER — Telehealth: Payer: Self-pay

## 2018-06-09 LAB — LIPID PANEL
CHOL/HDL RATIO: 3.3 ratio (ref 0.0–4.4)
Cholesterol, Total: 200 mg/dL — ABNORMAL HIGH (ref 100–199)
HDL: 61 mg/dL (ref >39)
LDL Calculated: 124 mg/dL — ABNORMAL HIGH (ref 0–99)
Triglycerides: 76 mg/dL (ref 0–149)
VLDL Cholesterol Cal: 15 mg/dL (ref 5–40)

## 2018-06-09 LAB — COMPREHENSIVE METABOLIC PANEL
ALBUMIN: 4.6 g/dL (ref 3.6–4.8)
ALT: 15 IU/L (ref 0–32)
AST: 11 IU/L (ref 0–40)
Albumin/Globulin Ratio: 2.9 — ABNORMAL HIGH (ref 1.2–2.2)
Alkaline Phosphatase: 97 IU/L (ref 39–117)
BUN/Creatinine Ratio: 13 (ref 12–28)
BUN: 11 mg/dL (ref 8–27)
Bilirubin Total: 0.3 mg/dL (ref 0.0–1.2)
CO2: 22 mmol/L (ref 20–29)
CREATININE: 0.84 mg/dL (ref 0.57–1.00)
Calcium: 9.3 mg/dL (ref 8.7–10.3)
Chloride: 103 mmol/L (ref 96–106)
GFR calc non Af Amer: 76 mL/min/{1.73_m2} (ref >59)
GFR, EST AFRICAN AMERICAN: 87 mL/min/{1.73_m2} (ref >59)
GLUCOSE: 170 mg/dL — AB (ref 65–99)
Globulin, Total: 1.6 g/dL (ref 1.5–4.5)
Potassium: 4 mmol/L (ref 3.5–5.2)
Sodium: 141 mmol/L (ref 134–144)
TOTAL PROTEIN: 6.2 g/dL (ref 6.0–8.5)

## 2018-06-09 NOTE — Telephone Encounter (Signed)
Patient advised as directed below.  Thanks,  -Audrina Marten 

## 2018-06-09 NOTE — Telephone Encounter (Signed)
-----   Message from Mar Daring, PA-C sent at 06/09/2018 10:11 AM EST ----- Cholesterol much improved. Kidney and liver function normal.

## 2018-07-21 ENCOUNTER — Encounter: Payer: BLUE CROSS/BLUE SHIELD | Admitting: Family Medicine

## 2018-07-28 ENCOUNTER — Other Ambulatory Visit: Payer: Self-pay | Admitting: Family Medicine

## 2018-07-28 ENCOUNTER — Encounter: Payer: Self-pay | Admitting: Family Medicine

## 2018-07-28 ENCOUNTER — Ambulatory Visit (INDEPENDENT_AMBULATORY_CARE_PROVIDER_SITE_OTHER): Payer: BLUE CROSS/BLUE SHIELD | Admitting: Family Medicine

## 2018-07-28 VITALS — BP 119/78 | HR 76 | Temp 97.8°F | Ht 65.0 in | Wt 146.6 lb

## 2018-07-28 DIAGNOSIS — E785 Hyperlipidemia, unspecified: Secondary | ICD-10-CM | POA: Diagnosis not present

## 2018-07-28 DIAGNOSIS — Z0000 Encounter for general adult medical examination without abnormal findings: Secondary | ICD-10-CM

## 2018-07-28 DIAGNOSIS — K635 Polyp of colon: Secondary | ICD-10-CM | POA: Diagnosis not present

## 2018-07-28 DIAGNOSIS — E1165 Type 2 diabetes mellitus with hyperglycemia: Secondary | ICD-10-CM | POA: Diagnosis not present

## 2018-07-28 DIAGNOSIS — Z1231 Encounter for screening mammogram for malignant neoplasm of breast: Secondary | ICD-10-CM

## 2018-07-28 DIAGNOSIS — Z720 Tobacco use: Secondary | ICD-10-CM

## 2018-07-28 LAB — POCT GLYCOSYLATED HEMOGLOBIN (HGB A1C): Hemoglobin A1C: 8.2 % — AB (ref 4.0–5.6)

## 2018-07-28 MED ORDER — SIMVASTATIN 40 MG PO TABS: 40.0000 mg | ORAL_TABLET | Freq: Every evening | ORAL | 3 refills | Status: DC

## 2018-07-28 NOTE — Assessment & Plan Note (Signed)
Needs to quit smoking but remains precontemplative

## 2018-07-28 NOTE — Assessment & Plan Note (Signed)
Discussed that LDL not to goal Increase simvastatin to 40mg  daily Recheck in 3 months

## 2018-07-28 NOTE — Assessment & Plan Note (Signed)
Last colonoscopy in 2017 showed sessile serated adenoma Needs repeat in 4 /2020 Referral to GI

## 2018-07-28 NOTE — Progress Notes (Signed)
Patient: Denise Torres, Female    DOB: 1955/11/03, 63 y.o.   MRN: [id number] Visit Date: 07/28/2018  Today's Provider: [name], MD   Chief Complaint  Patient presents with  . Annual Exam   Subjective:    I, Tiburcio Pea, CMA, am acting as a scribe for [name], MD.    Annual physical exam Denise Torres is a 63 y.o. female who presents today for health maintenance and complete physical. She feels fairly well. She reports exercising lightly.  She is looking forward to joining the gym which will be paid for by her job. She reports she is sleeping fairly well.  -----------------------------------------------------------------  Review of Systems  Constitutional: Negative.   HENT: Negative.   Eyes: Negative.   Respiratory: Negative.   Cardiovascular: Negative.   Gastrointestinal: Negative.   Endocrine: Negative.   Genitourinary: Negative.   Musculoskeletal: Negative.   Skin: Negative.   Allergic/Immunologic: Negative.   Neurological: Negative.   Hematological: Negative.   Psychiatric/Behavioral: Negative.     Social History      She  reports that she has been smoking cigarettes. She has a 5.00 pack-year smoking history. She has never used smokeless tobacco. She reports that she does not drink alcohol or use drugs.       Social History   Socioeconomic History  . Marital status: Married    Spouse name: Herbie [place]  . Number of children: 2  . Years of education: 16  . Highest education level: Bachelor's degree (e.g., BA, AB, BS)  Occupational History    Employer: [employer]  Social Needs  . Financial resource strain: Not hard at all  . Food insecurity:    Worry: Never true    Inability: Never true  . Transportation needs:    Medical: No    Non-medical: No  Tobacco Use  . Smoking status: Current Every Day Smoker    Packs/day: 0.50    Years: 10.00    Pack years: 5.00    Types: Cigarettes  . Smokeless tobacco: Never Used  . Tobacco  comment: started smoking around 2008  Substance and Sexual Activity  . Alcohol use: No    Comment: rarely  . Drug use: No  . Sexual activity: Yes  Lifestyle  . Physical activity:    Days per week: 0 days    Minutes per session: 0 min  . Stress: Not on file  Relationships  . Social connections:    Talks on phone: Not on file    Gets together: Not on file    Attends religious service: Not on file    Active member of club or organization: Not on file    Attends meetings of clubs or organizations: Not on file    Relationship status: Not on file  Other Topics Concern  . Not on file  Social History Narrative  . Not on file    Past Medical History:  Diagnosis Date  . Arthritis    mostly in b/l hands, left knee  . Blood transfusion without reported diagnosis   . Diabetes mellitus without complication ([place])   . Hyperlipidemia   . Lower GI bleed 09/2015     Patient Active Problem List   Diagnosis Date Noted  . Elevated BP without diagnosis of hypertension 04/19/2018  . H/O: GI bleed 03/23/2017  . HLD (hyperlipidemia) 03/23/2017  . Tobacco abuse 03/23/2017  . Colon polyps 03/23/2017  . Diabetes mellitus type 2, controlled, without complications ([place]) 38/03/1750  .  Acute lower GI bleeding 10/11/2015    Past Surgical History:  Procedure Laterality Date  . EYE SURGERY Bilateral 02/21/1998   lasik  . LAPAROSCOPIC SUPRACERVICAL HYSTERECTOMY  2010   partial  . TONSILLECTOMY  09/21/1977  . WISDOM TOOTH EXTRACTION Bilateral 1976    Family History        Family Status  Relation Name Status  . Mother  Alive  . Father  Alive  . Brother  Alive  . Brother  Deceased        Her family history includes Alcohol abuse in her brother; Atrial fibrillation in her mother; Breast cancer (age of onset: 15) in her mother; Diabetes in her father and mother; Drug abuse in her brother; Heart attack (age of onset: 20) in her father; Rheum arthritis in her brother; Stroke in her father.        No Known Allergies   Current Outpatient Medications:  .  simvastatin (ZOCOR) 10 MG tablet, Take 1 tablet (10 mg total) by mouth every evening., Disp: 90 tablet, Rfl: 2 .  metFORMIN (GLUCOPHAGE) 500 MG tablet, Take 2 tablets (1,000 mg total) by mouth 2 (two) times daily., Disp: 120 tablet, Rfl: 6   Patient Care Team: Virginia Crews, MD as PCP - General (Family Medicine)    Objective:    Vitals: BP 119/78 (BP Location: Right Arm, Patient Position: Sitting, Cuff Size: Normal)   Pulse 76   Temp 97.8 F (36.6 C) (Oral)   Ht 5\' 5"  (1.651 m)   Wt 146 lb 9.6 oz (66.5 kg)   SpO2 96%   BMI 24.40 kg/m    Vitals:   07/28/18 1107  BP: 119/78  Pulse: 76  Temp: 97.8 F (36.6 C)  TempSrc: Oral  SpO2: 96%  Weight: 146 lb 9.6 oz (66.5 kg)  Height: 5\' 5"  (1.651 m)     Physical Exam Vitals signs reviewed.  Constitutional:      General: She is not in acute distress.    Appearance: Normal appearance. She is well-developed. She is not diaphoretic.  HENT:     Head: Normocephalic and atraumatic.     Right Ear: Tympanic membrane, ear canal and external ear normal.     Left Ear: Tympanic membrane, ear canal and external ear normal.     Nose: Nose normal.     Mouth/Throat:     Mouth: Mucous membranes are moist.     Pharynx: Oropharynx is clear. No oropharyngeal exudate.  Eyes:     General: No scleral icterus.    Conjunctiva/sclera: Conjunctivae normal.     Pupils: Pupils are equal, round, and reactive to light.  Neck:     Musculoskeletal: Neck supple.     Thyroid: No thyromegaly.  Cardiovascular:     Rate and Rhythm: Normal rate and regular rhythm.     Pulses: Normal pulses.     Heart sounds: Normal heart sounds. No murmur.  Pulmonary:     Effort: Pulmonary effort is normal. No respiratory distress.     Breath sounds: Normal breath sounds. No wheezing or rales.  Abdominal:     General: Bowel sounds are normal. There is no distension.     Palpations: Abdomen is soft.      Tenderness: There is no abdominal tenderness. There is no guarding or rebound.  Musculoskeletal:        General: No deformity.     Right lower leg: No edema.     Left lower leg: No edema.  Lymphadenopathy:  Cervical: No cervical adenopathy.  Skin:    General: Skin is warm and dry.     Capillary Refill: Capillary refill takes less than 2 seconds.     Findings: No rash.  Neurological:     Mental Status: She is alert and oriented to person, place, and time.  Psychiatric:        Mood and Affect: Mood normal.        Behavior: Behavior normal.        Thought Content: Thought content normal.      Depression Screen PHQ 2/9 Scores 07/28/2018 07/13/2017 03/23/2017  PHQ - 2 Score 3 0 0  PHQ- 9 Score 4 - -       Results for orders placed or performed in visit on 07/28/18  POCT HgB A1C  Result Value Ref Range   Hemoglobin A1C 8.2 (A) 4.0 - 5.6 %   HbA1c POC (<> result, manual entry)     HbA1c, POC (prediabetic range)     HbA1c, POC (controlled diabetic range)       Assessment & Plan:     Routine Health Maintenance and Physical Exam  Exercise Activities and Dietary recommendations Goals   None     Immunization History  Administered Date(s) Administered  . Influenza,inj,Quad PF,6+ Mos 03/23/2017, 04/19/2018  . Pneumococcal Polysaccharide-23 04/19/2018  . Tdap 09/20/2009, 03/30/2014    Health Maintenance  Topic Date Due  . MAMMOGRAM  12/21/2013  . OPHTHALMOLOGY EXAM  04/06/2018  . COLONOSCOPY  10/03/2018  . HEMOGLOBIN A1C  10/18/2018  . FOOT EXAM  04/20/2019  . URINE MICROALBUMIN  04/20/2019  . TETANUS/TDAP  03/30/2024  . INFLUENZA VACCINE  Completed  . PNEUMOCOCCAL POLYSACCHARIDE VACCINE AGE 56-64 HIGH RISK  Completed  . Hepatitis C Screening  Completed  . HIV Screening  Completed     Discussed health benefits of physical activity, and encouraged her to engage in regular exercise appropriate for her age and condition.      --------------------------------------------------------------------  Problem List Items Addressed This Visit      Digestive   Colon polyps    Last colonoscopy in 2017 showed sessile serated adenoma Needs repeat in 4 /2020 Referral to GI      Relevant Orders   Ambulatory referral to Gastroenterology     Endocrine   T2DM (type 2 diabetes mellitus) ([place])    Uncontrolled A1c continues to elevate Patient is upset due to her mother passing and not willing to change medications at this time Continue metformin Discussed diet and exercise F/u in 3 months Needs to schedule eye exam      Relevant Medications   simvastatin (ZOCOR) 40 MG tablet     Other   HLD (hyperlipidemia)    Discussed that LDL not to goal Increase simvastatin to 40mg  daily Recheck in 3 months      Relevant Medications   simvastatin (ZOCOR) 40 MG tablet   Tobacco abuse    Needs to quit smoking but remains precontemplative       Other Visit Diagnoses    Encounter for annual physical exam    -  Primary       Return in about 3 months (around 10/26/2018) for chronic disease f/u.   The entirety of the information documented in the History of Present Illness, Review of Systems and Physical Exam were personally obtained by me. Portions of this information were initially documented by Tiburcio Pea, CMA and reviewed by me for thoroughness and accuracy.    [place],  Dionne Bucy, MD, MPH [facility] 07/28/2018 11:48 AM

## 2018-07-28 NOTE — Assessment & Plan Note (Signed)
Uncontrolled A1c continues to elevate Patient is upset due to her mother passing and not willing to change medications at this time Continue metformin Discussed diet and exercise F/u in 3 months Needs to schedule eye exam

## 2018-07-28 NOTE — Patient Instructions (Addendum)
Call and schedule mammogram Call and schedule a diabetic eye exam  Preventive Care 40-64 Years, Female Preventive care refers to lifestyle choices and visits with your health care provider that can promote health and wellness. What does preventive care include?   A yearly physical exam. This is also called an annual well check.  Dental exams once or twice a year.  Routine eye exams. Ask your health care provider how often you should have your eyes checked.  Personal lifestyle choices, including: ? Daily care of your teeth and gums. ? Regular physical activity. ? Eating a healthy diet. ? Avoiding tobacco and drug use. ? Limiting alcohol use. ? Practicing safe sex. ? Taking low-dose aspirin daily starting at age 43. ? Taking vitamin and mineral supplements as recommended by your health care provider. What happens during an annual well check? The services and screenings done by your health care provider during your annual well check will depend on your age, overall health, lifestyle risk factors, and family history of disease. Counseling Your health care provider may ask you questions about your:  Alcohol use.  Tobacco use.  Drug use.  Emotional well-being.  Home and relationship well-being.  Sexual activity.  Eating habits.  Work and work Statistician.  Method of birth control.  Menstrual cycle.  Pregnancy history. Screening You may have the following tests or measurements:  Height, weight, and BMI.  Blood pressure.  Lipid and cholesterol levels. These may be checked every 5 years, or more frequently if you are over 70 years old.  Skin check.  Lung cancer screening. You may have this screening every year starting at age 57 if you have a 30-pack-year history of smoking and currently smoke or have quit within the past 15 years.  Colorectal cancer screening. All adults should have this screening starting at age 73 and continuing until age 53. Your health care  provider may recommend screening at age 24. You will have tests every 1-10 years, depending on your results and the type of screening test. People at increased risk should start screening at an earlier age. Screening tests may include: ? Guaiac-based fecal occult blood testing. ? Fecal immunochemical test (FIT). ? Stool DNA test. ? Virtual colonoscopy. ? Sigmoidoscopy. During this test, a flexible tube with a tiny camera (sigmoidoscope) is used to examine your rectum and lower colon. The sigmoidoscope is inserted through your anus into your rectum and lower colon. ? Colonoscopy. During this test, a long, thin, flexible tube with a tiny camera (colonoscope) is used to examine your entire colon and rectum.  Hepatitis C blood test.  Hepatitis B blood test.  Sexually transmitted disease (STD) testing.  Diabetes screening. This is done by checking your blood sugar (glucose) after you have not eaten for a while (fasting). You may have this done every 1-3 years.  Mammogram. This may be done every 1-2 years. Talk to your health care provider about when you should start having regular mammograms. This may depend on whether you have a family history of breast cancer.  BRCA-related cancer screening. This may be done if you have a family history of breast, ovarian, tubal, or peritoneal cancers.  Pelvic exam and Pap test. This may be done every 3 years starting at age 66. Starting at age 64, this may be done every 5 years if you have a Pap test in combination with an HPV test.  Bone density scan. This is done to screen for osteoporosis. You may have this scan if you  are at high risk for osteoporosis. Discuss your test results, treatment options, and if necessary, the need for more tests with your health care provider. Vaccines Your health care provider may recommend certain vaccines, such as:  Influenza vaccine. This is recommended every year.  Tetanus, diphtheria, and acellular pertussis (Tdap, Td)  vaccine. You may need a Td booster every 10 years.  Varicella vaccine. You may need this if you have not been vaccinated.  Zoster vaccine. You may need this after age 38.  Measles, mumps, and rubella (MMR) vaccine. You may need at least one dose of MMR if you were born in 1957 or later. You may also need a second dose.  Pneumococcal 13-valent conjugate (PCV13) vaccine. You may need this if you have certain conditions and were not previously vaccinated.  Pneumococcal polysaccharide (PPSV23) vaccine. You may need one or two doses if you smoke cigarettes or if you have certain conditions.  Meningococcal vaccine. You may need this if you have certain conditions.  Hepatitis A vaccine. You may need this if you have certain conditions or if you travel or work in places where you may be exposed to hepatitis A.  Hepatitis B vaccine. You may need this if you have certain conditions or if you travel or work in places where you may be exposed to hepatitis B.  Haemophilus influenzae type b (Hib) vaccine. You may need this if you have certain conditions. Talk to your health care provider about which screenings and vaccines you need and how often you need them. This information is not intended to replace advice given to you by your health care provider. Make sure you discuss any questions you have with your health care provider. Document Released: 06/27/2015 Document Revised: 07/21/2017 Document Reviewed: 04/01/2015 Elsevier Interactive Patient Education  2019 [employer].

## 2018-08-09 ENCOUNTER — Other Ambulatory Visit: Payer: Self-pay

## 2018-08-09 ENCOUNTER — Ambulatory Visit
Admission: RE | Admit: 2018-08-09 | Discharge: 2018-08-09 | Disposition: A | Discharge status: Home / Self Care | Payer: BLUE CROSS/BLUE SHIELD | Source: Ambulatory Visit | Attending: Family Medicine | Admitting: Family Medicine

## 2018-08-09 ENCOUNTER — Other Ambulatory Visit: Payer: Self-pay | Admitting: Family

## 2018-08-09 DIAGNOSIS — Z1231 Encounter for screening mammogram for malignant neoplasm of breast: Secondary | ICD-10-CM | POA: Diagnosis not present

## 2018-08-09 DIAGNOSIS — K635 Polyp of colon: Secondary | ICD-10-CM

## 2018-08-14 ENCOUNTER — Telehealth: Payer: Self-pay

## 2018-08-14 NOTE — Telephone Encounter (Signed)
Patient was advised.  

## 2018-08-14 NOTE — Telephone Encounter (Signed)
-----   Message from Virginia Crews, MD sent at 08/14/2018 11:18 AM EST ----- Normal mammogram. Repeat in 1 yr

## 2018-08-29 ENCOUNTER — Telehealth: Payer: Self-pay

## 2018-08-29 NOTE — Telephone Encounter (Signed)
Received voice message from patient to cancel her colonoscopy scheduled with Dr. Bonna Gains on 09/01/18 at [facility].  Returned patients call to make her aware that her colonoscopy has been canceled and we would like for her to call back to reschedule her procedure when she is ready to do so.  Cancellation Due to COVID 19 No Fee  Thanks Sharyn Lull

## 2018-09-01 ENCOUNTER — Ambulatory Visit: Admit: 2018-09-01 | Payer: BLUE CROSS/BLUE SHIELD | Admitting: Gastroenterology

## 2018-09-01 SURGERY — COLONOSCOPY WITH PROPOFOL
Anesthesia: General

## 2018-09-14 ENCOUNTER — Telehealth: Payer: Self-pay

## 2018-09-14 NOTE — Telephone Encounter (Signed)
Contacted patient to see if she would like to go ahead with rescheduling her colonoscopy (K63.5 Colon Polyps/Dr. Bonna Gains)  to June/July.  She said she would call back at a later time due to her travel/work from home schedule.  Thanks [employer]

## 2018-10-30 ENCOUNTER — Ambulatory Visit: Payer: BLUE CROSS/BLUE SHIELD | Admitting: Family Medicine

## 2018-11-01 ENCOUNTER — Ambulatory Visit (INDEPENDENT_AMBULATORY_CARE_PROVIDER_SITE_OTHER): Payer: BLUE CROSS/BLUE SHIELD | Admitting: Family Medicine

## 2018-11-01 ENCOUNTER — Encounter: Payer: Self-pay | Admitting: Family Medicine

## 2018-11-01 VITALS — Temp 97.7°F | Wt 145.0 lb

## 2018-11-01 DIAGNOSIS — E1169 Type 2 diabetes mellitus with other specified complication: Secondary | ICD-10-CM | POA: Diagnosis not present

## 2018-11-01 DIAGNOSIS — Z720 Tobacco use: Secondary | ICD-10-CM | POA: Diagnosis not present

## 2018-11-01 DIAGNOSIS — E785 Hyperlipidemia, unspecified: Secondary | ICD-10-CM

## 2018-11-01 NOTE — Progress Notes (Signed)
Patient: Denise Torres Female    DOB: 03/10/1956   63 y.o.   MRN: [id number] Visit Date: 11/02/2018  Today's Provider: [name], MD   Chief Complaint  Patient presents with  . Follow-up    DM and HLD   Subjective:    I,Joseline E. Rosas,RMA am acting as a scribe for Virginia Crews, MD.  Virtual Visit via Video Note  I connected with Denise Torres on 11/02/18 at  9:40 AM EDT by a video enabled telemedicine application and verified that I am speaking with the correct person using two identifiers.   Patient location: home Provider location: home office  Persons involved in the visit: patient, provider    I discussed the limitations of evaluation and management by telemedicine and the availability of in person appointments. The patient expressed understanding and agreed to proceed.   HPI   Diabetes Mellitus Type II, Follow-up:   Lab Results  Component Value Date   HGBA1C 8.2 (A) 07/28/2018   HGBA1C 7.5 (A) 04/19/2018   HGBA1C 6.8 (H) 03/23/2017    Last seen for diabetes 3 months ago.  Management since then includes none. She reports excellent compliance with treatment. She is not having side effects.  Current symptoms include none and have been stable. Home blood sugar records: not being checked  Episodes of hypoglycemia? no Most Recent Eye Exam: 03/2017 - will get repeat post-pandemic Weight trend: stable  Pertinent Labs:    Component Value Date/Time   CHOL 200 (H) 06/08/2018 0805   TRIG 76 06/08/2018 0805   HDL 61 06/08/2018 0805   LDLCALC 124 (H) 06/08/2018 0805   LDLCALC 133 (H) 03/23/2017 1540   CREATININE 0.84 06/08/2018 0805   CREATININE 0.90 03/23/2017 1540     Lipid/Cholesterol, Follow-up:   Last seen for this3 months ago.  Management changes since that visit include increase Simvastatin 40 mg Risk factors for vascular disease include diabetes mellitus and hypercholesterolemia  She reports good compliance with treatment.  She is not having side effects.  Current symptoms include none and have been stable.  Wt Readings from Last 3 Encounters:  11/01/18 145 lb (65.8 kg)  07/28/18 146 lb 9.6 oz (66.5 kg)  04/19/18 146 lb (66.2 kg)    -------------------------------------------------------------------    No Known Allergies   Current Outpatient Medications:  .  simvastatin (ZOCOR) 40 MG tablet, Take 1 tablet (40 mg total) by mouth every evening., Disp: 90 tablet, Rfl: 3 .  metFORMIN (GLUCOPHAGE) 500 MG tablet, Take 2 tablets (1,000 mg total) by mouth 2 (two) times daily., Disp: 120 tablet, Rfl: 6  Review of Systems  Constitutional: Negative.   Respiratory: Negative.   Cardiovascular: Negative.   Gastrointestinal: Negative.   Genitourinary: Negative.   Psychiatric/Behavioral: Negative.     Social History   Tobacco Use  . Smoking status: Current Every Day Smoker    Packs/day: 0.50    Years: 10.00    Pack years: 5.00    Types: Cigarettes  . Smokeless tobacco: Never Used  . Tobacco comment: started smoking around 2008  Substance Use Topics  . Alcohol use: No    Comment: rarely      Objective:   Temp 97.7 F (36.5 C) (Oral)   Wt 145 lb (65.8 kg)   BMI 24.13 kg/m  Vitals:   11/01/18 0933  Temp: 97.7 F (36.5 C)  TempSrc: Oral  Weight: 145 lb (65.8 kg)     Physical Exam Constitutional:  Appearance: Normal appearance.  Pulmonary:     Effort: Pulmonary effort is normal. No respiratory distress.  Neurological:     Mental Status: She is alert and oriented to person, place, and time. Mental status is at baseline.  Psychiatric:        Mood and Affect: Mood normal.        Behavior: Behavior normal.        Assessment & Plan   Follow Up Instructions: I discussed the assessment and treatment plan with the patient. The patient was provided an opportunity to ask questions and all were answered. The patient agreed with the plan and demonstrated an understanding of the  instructions.   The patient was advised to call back or seek an in-person evaluation if the symptoms worsen or if the condition fails to improve as anticipated.  Problem List Items Addressed This Visit      Endocrine   T2DM (type 2 diabetes mellitus) ([place]) - Primary    Previously uncontrolled Continue metformin at current dose Recheck A1c Update vaccines and screenings at next in person visit      Relevant Orders   Hemoglobin A1c     Other   HLD (hyperlipidemia)    Previously uncontrolled Simvastatin dose was increased at last visit Recheck CMP and FLP      Relevant Orders   Comprehensive metabolic panel   Lipid panel   Tobacco abuse    Know that she needs to quit She remains precontemplative Stress is not too high in the setting of the pandemic          Return in about 3 months (around 02/01/2019) for chronic disease f/u.   The entirety of the information documented in the History of Present Illness, Review of Systems and Physical Exam were personally obtained by me. Portions of this information were initially documented by Lyndel Pleasure, CMA and reviewed by me for thoroughness and accuracy.    Brieann Osinski, Dionne Bucy, MD MPH [facility]

## 2018-11-01 NOTE — Assessment & Plan Note (Signed)
Know that she needs to quit She remains precontemplative Stress is not too high in the setting of the pandemic

## 2018-11-02 NOTE — Assessment & Plan Note (Signed)
Previously uncontrolled Simvastatin dose was increased at last visit Recheck CMP and FLP

## 2018-11-02 NOTE — Assessment & Plan Note (Signed)
Previously uncontrolled Continue metformin at current dose Recheck A1c Update vaccines and screenings at next in person visit

## 2018-11-03 DIAGNOSIS — E785 Hyperlipidemia, unspecified: Secondary | ICD-10-CM | POA: Diagnosis not present

## 2018-11-03 DIAGNOSIS — E1169 Type 2 diabetes mellitus with other specified complication: Secondary | ICD-10-CM | POA: Diagnosis not present

## 2018-11-04 LAB — COMPREHENSIVE METABOLIC PANEL
ALT: 67 IU/L — ABNORMAL HIGH (ref 0–32)
AST: 38 IU/L (ref 0–40)
Albumin/Globulin Ratio: 2.7 — ABNORMAL HIGH (ref 1.2–2.2)
Albumin: 4.9 g/dL (ref 3.8–4.9)
Alkaline Phosphatase: 90 IU/L (ref 39–117)
BUN/Creatinine Ratio: 13 (ref 12–28)
BUN: 9 mg/dL (ref 8–27)
Bilirubin Total: 0.3 mg/dL (ref 0.0–1.2)
CO2: 22 mmol/L (ref 20–29)
Calcium: 9.4 mg/dL (ref 8.7–10.3)
Chloride: 103 mmol/L (ref 96–106)
Creatinine, Ser: 0.68 mg/dL (ref 0.57–1.00)
GFR calc Af Amer: 110 mL/min/{1.73_m2} (ref >59)
GFR calc non Af Amer: 95 mL/min/{1.73_m2} (ref >59)
Globulin, Total: 1.8 g/dL (ref 1.5–4.5)
Glucose: 173 mg/dL — ABNORMAL HIGH (ref 65–99)
Potassium: 4.2 mmol/L (ref 3.5–5.2)
Sodium: 142 mmol/L (ref 134–144)
Total Protein: 6.7 g/dL (ref 6.0–8.5)

## 2018-11-04 LAB — HEMOGLOBIN A1C
Est. average glucose Bld gHb Est-mCnc: 177 mg/dL
Hgb A1c MFr Bld: 7.8 % — ABNORMAL HIGH (ref 4.8–5.6)

## 2018-11-04 LAB — LIPID PANEL
Chol/HDL Ratio: 2.3 ratio (ref 0.0–4.4)
Cholesterol, Total: 128 mg/dL (ref 100–199)
HDL: 55 mg/dL (ref >39)
LDL Calculated: 57 mg/dL (ref 0–99)
Triglycerides: 81 mg/dL (ref 0–149)
VLDL Cholesterol Cal: 16 mg/dL (ref 5–40)

## 2018-11-09 ENCOUNTER — Telehealth: Payer: Self-pay

## 2018-11-09 DIAGNOSIS — E1169 Type 2 diabetes mellitus with other specified complication: Secondary | ICD-10-CM

## 2018-11-09 MED ORDER — EMPAGLIFLOZIN 10 MG PO TABS: 10.0000 mg | ORAL_TABLET | Freq: Every day | ORAL | 2 refills | Status: DC

## 2018-11-09 NOTE — Telephone Encounter (Signed)
-----   Message from Virginia Crews, MD sent at 11/09/2018  8:11 AM EDT ----- Normal kidney function, electrolytes. Liver function is very slightly elevated.  Watch alcohol and tylenol intake. We can repeat in 3 to 6 months to ensure it is not increasing.  Strahl is well controlled and at goal.  A1c is improving, but still elevated at 7.8.  Continue to take the metformin 1000 mg twice daily.  We need to add a second medication for diabetes.  I recommend Jardiance.  This is a once a day pill that helps you pee out extra sugar.  It is well-tolerated and a good second line diabetes medication.  It can also help decrease blood pressure and help with weight loss.  Possible side effects are yeast infections and UTIs as bacteria and yeast like sugar.  In general, patients taking this medication do not see an increase in these however.  If patient agrees to start, okay to E prescribe Jardiance 10 mg daily #30 with 2 refills.  We need to repeat her A1c in 3 months.

## 2018-11-09 NOTE — Telephone Encounter (Signed)
Advised patient as below. Medication was sent into the pharmacy. Patient has an appt in 3 months and will obtain labs at that time.

## 2019-02-02 ENCOUNTER — Ambulatory Visit (INDEPENDENT_AMBULATORY_CARE_PROVIDER_SITE_OTHER): Payer: BC Managed Care – PPO | Admitting: Family Medicine

## 2019-02-02 ENCOUNTER — Encounter: Payer: Self-pay | Admitting: Family Medicine

## 2019-02-02 ENCOUNTER — Other Ambulatory Visit: Payer: Self-pay

## 2019-02-02 VITALS — BP 117/74 | HR 85 | Temp 97.5°F | Wt 141.2 lb

## 2019-02-02 DIAGNOSIS — Z23 Encounter for immunization: Secondary | ICD-10-CM | POA: Diagnosis not present

## 2019-02-02 DIAGNOSIS — K635 Polyp of colon: Secondary | ICD-10-CM

## 2019-02-02 DIAGNOSIS — Z720 Tobacco use: Secondary | ICD-10-CM

## 2019-02-02 DIAGNOSIS — E1165 Type 2 diabetes mellitus with hyperglycemia: Secondary | ICD-10-CM

## 2019-02-02 LAB — POCT GLYCOSYLATED HEMOGLOBIN (HGB A1C)
Est. average glucose Bld gHb Est-mCnc: 163
Hemoglobin A1C: 7.3 % — AB (ref 4.0–5.6)

## 2019-02-02 MED ORDER — CANAGLIFLOZIN 100 MG PO TABS: 100.0000 mg | ORAL_TABLET | Freq: Every day | ORAL | 3 refills | Status: DC

## 2019-02-02 NOTE — Assessment & Plan Note (Signed)
Uncontrolled A1c is improving though Continue metformin at goal dose Start invokana 100mg  daily (Jardiance was unaffordable) UTD on vaccines, foot exam, and microalbumin Needs eye exam On statin F/u in 3 months

## 2019-02-02 NOTE — Assessment & Plan Note (Signed)
Remains precontemplative

## 2019-02-02 NOTE — Patient Instructions (Addendum)
Schedule a diabetic eye exam  Start Invokana 100mg  daily  It is safe to go ahead and schedule colonoscopy

## 2019-02-02 NOTE — Addendum Note (Signed)
Addended by: Dan Maker on: 02/02/2019 08:41 AM   Modules accepted: Orders

## 2019-02-02 NOTE — Assessment & Plan Note (Signed)
Last colonoscopy in 2017 showed sessile serated adenoma  Needs repeat colonoscopy Advised to schedule

## 2019-02-02 NOTE — Progress Notes (Signed)
Patient: Denise Torres Female    DOB: 04/23/1956   63 y.o.   MRN: [id number] Visit Date: 02/02/2019  Today's Provider: [name], MD   Chief Complaint  Patient presents with   Diabetes   Hyperlipidemia   Subjective:    HPI  Diabetes Mellitus Type II, Follow-up:   Lab Results  Component Value Date   HGBA1C 7.3 (A) 02/02/2019   HGBA1C 7.8 (H) 11/03/2018   HGBA1C 8.2 (A) 07/28/2018   Last seen for diabetes 3 months ago.  Management since then includes none. She reports good compliance with treatment. She is not having side effects.  Current symptoms include none and have been stable. Home blood sugar records: not being checked  Episodes of hypoglycemia? no   Current Insulin Regimen: none Most Recent Eye Exam: needs Weight trend: stable Prior visit with dietician: no Current diet: in general, a "healthy" diet   Current exercise: none  ------------------------------------------------------------------------   Lipid/Cholesterol, Follow-up:   Last seen for this 3 months ago.  Management since that visit includes none.  Last Lipid Panel:    Component Value Date/Time   CHOL 128 11/03/2018 0835   TRIG 81 11/03/2018 0835   HDL 55 11/03/2018 0835   CHOLHDL 2.3 11/03/2018 0835   CHOLHDL 3.4 03/23/2017 1540   LDLCALC 57 11/03/2018 0835   LDLCALC 133 (H) 03/23/2017 1540    She reports good compliance with treatment. She is not having side effects.   Wt Readings from Last 3 Encounters:  02/02/19 141 lb 3.2 oz (64 kg)  11/01/18 145 lb (65.8 kg)  07/28/18 146 lb 9.6 oz (66.5 kg)    ------------------------------------------------------------------------    No Known Allergies   Current Outpatient Medications:    simvastatin (ZOCOR) 40 MG tablet, Take 1 tablet (40 mg total) by mouth every evening., Disp: 90 tablet, Rfl: 3   canagliflozin (INVOKANA) 100 MG TABS tablet, Take 1 tablet (100 mg total) by mouth daily before breakfast., Disp: 30  tablet, Rfl: 3   empagliflozin (JARDIANCE) 10 MG TABS tablet, Take 10 mg by mouth daily. (Patient not taking: Reported on 02/02/2019), Disp: 30 tablet, Rfl: 2   metFORMIN (GLUCOPHAGE) 500 MG tablet, Take 2 tablets (1,000 mg total) by mouth 2 (two) times daily., Disp: 120 tablet, Rfl: 6  Review of Systems  Constitutional: Negative.   Respiratory: Negative.   Genitourinary: Negative.   Neurological: Negative.     Social History   Tobacco Use   Smoking status: Current Every Day Smoker    Packs/day: 0.50    Years: 10.00    Pack years: 5.00    Types: Cigarettes   Smokeless tobacco: Never Used   Tobacco comment: started smoking around 2008  Substance Use Topics   Alcohol use: No    Comment: rarely      Objective:   BP 117/74 (BP Location: Left Arm, Patient Position: Sitting, Cuff Size: Normal)    Pulse 85    Temp (!) 97.5 F (36.4 C) (Temporal)    Wt 141 lb 3.2 oz (64 kg)    SpO2 96%    BMI 23.50 kg/m  Vitals:   02/02/19 0813  BP: 117/74  Pulse: 85  Temp: (!) 97.5 F (36.4 C)  TempSrc: Temporal  SpO2: 96%  Weight: 141 lb 3.2 oz (64 kg)     Physical Exam Vitals signs reviewed.  Constitutional:      General: She is not in acute distress.    Appearance: Normal appearance. She is well-developed.  She is not diaphoretic.  HENT:     Head: Normocephalic and atraumatic.  Eyes:     General: No scleral icterus.    Conjunctiva/sclera: Conjunctivae normal.  Neck:     Musculoskeletal: Neck supple.     Thyroid: No thyromegaly.  Cardiovascular:     Rate and Rhythm: Normal rate and regular rhythm.     Pulses: Normal pulses.     Heart sounds: Normal heart sounds. No murmur.  Pulmonary:     Effort: Pulmonary effort is normal. No respiratory distress.     Breath sounds: Normal breath sounds. No wheezing, rhonchi or rales.  Musculoskeletal:     Right lower leg: No edema.     Left lower leg: No edema.  Lymphadenopathy:     Cervical: No cervical adenopathy.  Skin:     General: Skin is warm and dry.     Capillary Refill: Capillary refill takes less than 2 seconds.     Findings: No rash.  Neurological:     Mental Status: She is alert and oriented to person, place, and time. Mental status is at baseline.  Psychiatric:        Mood and Affect: Mood normal.        Behavior: Behavior normal.     Results for orders placed or performed in visit on 02/02/19  POCT glycosylated hemoglobin (Hb A1C)  Result Value Ref Range   Hemoglobin A1C 7.3 (A) 4.0 - 5.6 %   HbA1c POC (<> result, manual entry)     HbA1c, POC (prediabetic range)     HbA1c, POC (controlled diabetic range)     Est. average glucose Bld gHb Est-mCnc 163        Assessment & Plan   Problem List Items Addressed This Visit      Digestive   Colon polyps    Last colonoscopy in 2017 showed sessile serated adenoma  Needs repeat colonoscopy Advised to schedule        Endocrine   T2DM (type 2 diabetes mellitus) ([place]) - Primary    Uncontrolled A1c is improving though Continue metformin at goal dose Start invokana 100mg  daily (Jardiance was unaffordable) UTD on vaccines, foot exam, and microalbumin Needs eye exam On statin F/u in 3 months      Relevant Medications   canagliflozin (INVOKANA) 100 MG TABS tablet   Other Relevant Orders   POCT glycosylated hemoglobin (Hb A1C) (Completed)     Other   Tobacco abuse    Remains precontemplative          Return in about 3 months (around 05/05/2019) for Diabetes f/u.   The entirety of the information documented in the History of Present Illness, Review of Systems and Physical Exam were personally obtained by me. Portions of this information were initially documented by [facility], CMA and reviewed by me for thoroughness and accuracy.    Dorthea Maina, Dionne Bucy, MD MPH [facility]

## 2019-02-06 ENCOUNTER — Encounter: Payer: Self-pay | Admitting: Family Medicine

## 2019-02-06 DIAGNOSIS — E1165 Type 2 diabetes mellitus with hyperglycemia: Secondary | ICD-10-CM

## 2019-02-06 DIAGNOSIS — E785 Hyperlipidemia, unspecified: Secondary | ICD-10-CM

## 2019-02-08 ENCOUNTER — Ambulatory Visit: Payer: Self-pay | Admitting: Pharmacist

## 2019-02-08 DIAGNOSIS — E1165 Type 2 diabetes mellitus with hyperglycemia: Secondary | ICD-10-CM

## 2019-02-08 NOTE — Chronic Care Management (AMB) (Signed)
  Care Management   Note  02/08/2019 Name: Denise Torres MRN: [id number] DOB: 1956-05-07  63 y.o. year old female referred to Chronic Care Management by Dr. Brita Romp for medication management and assistance (SGLT2). Last office visit with Virginia Crews, MD was 02/02/19.   Was unable to reach patient via telephone today and have left HIPAA compliant voicemail asking patient to return my call. (unsuccessful outreach #1).  Follow up outreach in the next 5-7 days  Ruben Reason, PharmD Clinical Pharmacist [place] [phone]

## 2019-02-22 ENCOUNTER — Ambulatory Visit: Payer: Self-pay | Admitting: Pharmacist

## 2019-02-22 NOTE — Chronic Care Management (AMB) (Signed)
  Care Management   Note  02/22/2019 Name: Denise Torres MRN: [id number] DOB: Dec 22, 1955  63 y.o. year old female referred to Chronic Care Management by Dr. Brita Romp for medication management and assistance (SGLT2). Last office visit with Virginia Crews, MD was 02/02/19.   Was unable to reach patient via telephone today and have left HIPAA compliant voicemail asking patient to return my call. (unsuccessful outreach #2).  Follow up outreach in the next 5-7 days  Ruben Reason, PharmD Clinical Pharmacist [place] [phone]

## 2019-03-02 ENCOUNTER — Ambulatory Visit: Payer: Self-pay | Admitting: Pharmacist

## 2019-03-02 NOTE — Chronic Care Management (AMB) (Signed)
  Care Management   Note  03/02/2019 Name: Denise Torres MRN: [id number] DOB: 1956-02-20  Denise Torres is a 63 y.o. year old female who sees Bacigalupo, Dionne Bucy, MD for primary care. Dr. Brita Romp asked the CCM team to consult the patient for assistance with chronic disease management related to medication assistance (SGLT2). Referral was placed 02/08/19. Telephone outreach to patient today to introduce care management services.   Plan: [name] agreed that care management services would be helpful to him/her and verbal consent was obtained. I have scheduled a follow up call for 9/29 at 12:15 (this is the best time to call her due to her work schedule)   [name] was given information about Care Management services today including:  1. Case Management services includes personalized support from designated clinical staff supervised by her physician, including individualized plan of care and coordination with other care providers 2. 24/7 contact phone numbers for assistance for urgent and routine care needs. 3. The patient may stop case management services at any time by phone call to the office staff.  Patient agreed to services and verbal consent obtained.    Ruben Reason, PharmD Clinical Pharmacist [place] [phone]

## 2019-03-13 ENCOUNTER — Ambulatory Visit: Payer: Self-pay | Admitting: Pharmacist

## 2019-03-13 DIAGNOSIS — E1165 Type 2 diabetes mellitus with hyperglycemia: Secondary | ICD-10-CM

## 2019-03-13 NOTE — Chronic Care Management (AMB) (Signed)
  Care Management   Note  03/13/2019 Name: Denise Torres MRN: [id number] DOB: 02-20-1956  Reason for referral: medication assistance- SGLT2  Referral source: Dr. Brita Romp Referral medication(s): SGLT2 inhibitors (Jardiance/Farxiga/Invokana) Current insurance: BCBS HSA commercial plan- high deductible  PMHx: T2DM, HLD, tobacco use   HPI:  Patient has high deductible health plan, copay for Jardiance and Invokana was $1600 due to deductible. Has not used manufacturer coupon.  Objective:  Lab Results  Component Value Date   HGBA1C 7.3 (A) 02/02/2019   HGBA1C 7.8 (H) 11/03/2018   HGBA1C 8.2 (A) 07/28/2018   Lab Results  Component Value Date   MICROALBUR 20 04/19/2018   LDLCALC 57 11/03/2018   CREATININE 0.68 11/03/2018    No Known Allergies  Medications Reviewed Today    Reviewed by Virginia Crews, MD (Physician) on 02/02/19 at [place] List Status: <None>  Medication Order Taking? Sig Documenting Provider Last Dose Status Informant  empagliflozin (JARDIANCE) 10 MG TABS tablet [id number] No Take 10 mg by mouth daily.  Patient not taking: Reported on 02/02/2019   Virginia Crews, MD Not Taking Active   metFORMIN (GLUCOPHAGE) 500 MG tablet [id number]  Take 2 tablets (1,000 mg total) by mouth 2 (two) times daily. Virginia Crews, MD  Expired 05/19/18 2359   simvastatin (ZOCOR) 40 MG tablet [id number] Yes Take 1 tablet (40 mg total) by mouth every evening. Virginia Crews, MD Taking Active           Assessment:   Wilder Glade, made by Aggie Cosier, allows patients with prescription insurance to apply to their assistance program. CCM pharmacist to verify if patient qualifies   Goals Addressed            This Visit's Progress   . I can't afford Jardiance or Invokana (pt-stated)       Current Barriers:  . Financial . High deductible health plan ($5500)  Pharmacist Clinical Goal(s): Over the next 14 days, Denise Torres will provide the necessary  supplementary documents (proof of out of pocket prescription expenditure, proof of household income) needed for medication assistance applications to CCM pharmacist.   Interventions: . CCM pharmacist will apply for medication assistance program for  o Ascertain patient qualifies for Farxiga medication assistance by [employer] . Provide patient with manufacturer coupon to utilize to bring copay down  Patient Self Care Activities:  Marland Kitchen Gather necessary documents needed to apply for medication assistance  Initial goal documentation        Plan: Patient is to provide additional documents as necessary.   Follow up: Telephone follow up with PharmD in 5-7 days  Ruben Reason, PharmD Clinical Pharmacist [place] [phone]

## 2019-03-19 ENCOUNTER — Ambulatory Visit: Payer: Self-pay | Admitting: Pharmacist

## 2019-03-23 NOTE — Chronic Care Management (AMB) (Signed)
  Care Management   Care Coordination Note   03/23/2019- late entry Name: Denise Torres MRN: [id number] DOB: 07-10-1955  Care coordination with patient's community pharmacy to troubleshoot medication coupons from manufacturers.    Goals Addressed            This Visit's Progress   . I can't afford Jardiance or Invokana (pt-stated)       Current Barriers:  . Financial . High deductible health plan ($5500)  Pharmacist Clinical Goal(s): Over the next 14 days, [name] will provide the necessary supplementary documents (proof of out of pocket prescription expenditure, proof of household income) needed for medication assistance applications to CCM pharmacist.   Interventions: . CCM pharmacist will apply for medication assistance program for  o Ascertain patient qualifies for Farxiga medication assistance by [employer] . Provide patient with manufacturer coupon to utilize to bring copay down . Updated 10/5: collaboration with [place] pharmacist to verify patient is not using any coupon and there is no further insurance rejection information  Patient Self Care Activities:  Marland Kitchen Gather necessary documents needed to apply for medication assistance  Please see past updates related to this goal by clicking on the "Past Updates" button in the selected goal          Telephone follow up appointment with care management team member scheduled for: approximately 1 week  Ruben Reason, PharmD Clinical Pharmacist [place] [phone]

## 2019-03-26 ENCOUNTER — Ambulatory Visit: Payer: Self-pay | Admitting: Pharmacist

## 2019-03-26 DIAGNOSIS — E1165 Type 2 diabetes mellitus with hyperglycemia: Secondary | ICD-10-CM

## 2019-03-26 NOTE — Chronic Care Management (AMB) (Signed)
  Care Management   Follow Up Note   03/26/2019 Name: Denise Torres MRN: [id number] DOB: March 07, 1956  Subjective Denise Torres is a 63 y.o. year old female who is a primary care patient of Bacigalupo, Dionne Bucy, MD. The CCM team was consulted for assistance with chronic disease management and care coordination needs.    Review of patient status, including review of consultants reports, relevant laboratory and other test results, and collaboration with appropriate care team members and the patient's provider was performed as part of comprehensive patient evaluation and provision of chronic care management services.    Outpatient Encounter Medications as of 03/26/2019  Medication Sig  . canagliflozin (INVOKANA) 100 MG TABS tablet Take 1 tablet (100 mg total) by mouth daily before breakfast. (Patient not taking: Reported on 03/26/2019)  . empagliflozin (JARDIANCE) 10 MG TABS tablet Take 10 mg by mouth daily. (Patient not taking: Reported on 02/02/2019)  . metFORMIN (GLUCOPHAGE) 500 MG tablet Take 2 tablets (1,000 mg total) by mouth 2 (two) times daily.  . simvastatin (ZOCOR) 40 MG tablet Take 1 tablet (40 mg total) by mouth every evening.   No facility-administered encounter medications on file as of 03/26/2019.      Goals Addressed            This Visit's Progress   . I can't afford Jardiance or Invokana (pt-stated)       Current Barriers:  . Financial . High deductible health plan ($5500)  Pharmacist Clinical Goal(s): Over the next 14 days, Denise Torres will provide the necessary supplementary documents (proof of out of pocket prescription expenditure, proof of household income) needed for medication assistance applications to CCM pharmacist.   Interventions: . CCM pharmacist will apply for medication assistance program for  o Ascertain patient qualifies for Farxiga medication assistance by [employer] Me . Provide patient with manufacturer coupon to utilize to bring copay down . Updated  10/5: collaboration with [place] pharmacist to verify patient is not using any coupon and there is no further insurance rejection information . Updated 10/12: patient states she has only paid a few hundred dollars into her $5500 deductible, so even the SGLT2 coupons with 1 free month are not an option; patient needs generic due to high deducitble plan, recommend pioglitazone  Patient Self Care Activities:  Marland Kitchen Gather necessary documents needed to apply for medication assistance  Please see past updates related to this goal by clicking on the "Past Updates" button in the selected goal          Plan: Recommendations for provider: pioglitazone 15mg  daily  Patient is under 48 years old  No family hx of bladder cancer  No CHF   Maintain beta cell insulin sensitivity  Will need to monitor liver function closely as ALT elevated 11/03/2018  Follow up Telephone follow up appointment with care management team member scheduled for: Thursday October 15   Ruben Reason, PharmD Clinical Pharmacist [place] [phone]

## 2019-03-29 ENCOUNTER — Ambulatory Visit: Payer: Self-pay | Admitting: Pharmacist

## 2019-03-29 ENCOUNTER — Other Ambulatory Visit: Payer: Self-pay | Admitting: Family Medicine

## 2019-03-29 DIAGNOSIS — E1165 Type 2 diabetes mellitus with hyperglycemia: Secondary | ICD-10-CM

## 2019-03-29 MED ORDER — PIOGLITAZONE HCL 15 MG PO TABS: 15.0000 mg | ORAL_TABLET | Freq: Every day | ORAL | 3 refills | Status: DC

## 2019-03-29 NOTE — Progress Notes (Signed)
Will start Actos as patient is not covered by insuracne for SGLT2 or GLP1. Start with low dose at 15mg  daily.  See CCM notes for other details.

## 2019-03-29 NOTE — Patient Instructions (Signed)
Goals Addressed            This Visit's Progress   . I can't afford Jardiance or Invokana (pt-stated)       Current Barriers:  . Financial . High deductible health plan ($5500)  Pharmacist Clinical Goal(s): Over the next 14 days, [name] will provide the necessary supplementary documents (proof of out of pocket prescription expenditure, proof of household income) needed for medication assistance applications to CCM pharmacist.   Interventions: . CCM pharmacist will apply for medication assistance program for  o Ascertain patient qualifies for Farxiga medication assistance by [employer] Me . Provide patient with manufacturer coupon to utilize to bring copay down . Updated 10/5: collaboration with [place] pharmacist to verify patient is not using any coupon and there is no further insurance rejection information . Updated 10/12: patient states she has only paid a few hundred dollars into her $5500 deductible, so even the SGLT2 coupons with 1 free month are not an option; patient needs generic due to high deducitble plan, recommend pioglitazone . Updated 10/15: Dr. B approved pioglitazone recommendation; new rx: pioglitazone 15mg  daily; Patient educated on purpose, proper use and potential adverse effects of pioglitazone    Patient Self Care Activities:  Marland Kitchen Gather necessary documents needed to apply for medication assistance  Please see past updates related to this goal by clicking on the "Past Updates" button in the selected goal

## 2019-03-29 NOTE — Chronic Care Management (AMB) (Signed)
  Care Management   Follow Up Note   03/29/2019 Name: Denise Torres MRN: [id number] DOB: [birth year]  Subjective Denise Torres is a 63 y.o. year old female who is a primary care patient of Bacigalupo, Dionne Bucy, MD. The care management team was consulted for assistance with care management and care coordination needs. Clinical pharmacy follow up today for medication management of T2DM. HIPAA identifiers verified.     Review of patient status, including review of consultants reports, relevant laboratory and other test results, and collaboration with appropriate care team members and the patient's provider was performed as part of comprehensive patient evaluation and provision of chronic care management services.    Medications Reviewed Today    Reviewed by Cathi Roan, [facility] (Pharmacist) on 03/26/19 at 1637  Med List Status: <None>  Medication Order Taking? Sig Documenting Provider Last Dose Status Informant  canagliflozin (INVOKANA) 100 MG TABS tablet [id number] No Take 1 tablet (100 mg total) by mouth daily before breakfast.  Patient not taking: Reported on 03/26/2019   Virginia Crews, MD Not Taking Active   empagliflozin (JARDIANCE) 10 MG TABS tablet [id number] No Take 10 mg by mouth daily.  Patient not taking: Reported on 02/02/2019   Virginia Crews, MD Not Taking Active   metFORMIN (GLUCOPHAGE) 500 MG tablet [id number]  Take 2 tablets (1,000 mg total) by mouth 2 (two) times daily. Virginia Crews, MD  Expired 05/19/18 2359   simvastatin (ZOCOR) 40 MG tablet [id number]  Take 1 tablet (40 mg total) by mouth every evening. Virginia Crews, MD  Active            Goals Addressed            This Visit's Progress   . I can't afford Jardiance or Invokana (pt-stated)       Current Barriers:  . Financial . High deductible health plan ($5500)  Pharmacist Clinical Goal(s): Over the next 14 days, Denise Torres will provide the necessary supplementary documents (proof  of out of pocket prescription expenditure, proof of household income) needed for medication assistance applications to CCM pharmacist.   Interventions: . CCM pharmacist will apply for medication assistance program for  o Ascertain patient qualifies for Farxiga medication assistance by [employer] Me . Provide patient with manufacturer coupon to utilize to bring copay down . Updated 10/5: collaboration with [place] pharmacist to verify patient is not using any coupon and there is no further insurance rejection information . Updated 10/12: patient states she has only paid a few hundred dollars into her $5500 deductible, so even the SGLT2 coupons with 1 free month are not an option; patient needs generic due to high deducitble plan, recommend pioglitazone . Updated 10/15: Dr. B approved pioglitazone recommendation; new rx: pioglitazone 15mg  daily; Patient educated on purpose, proper use and potential adverse effects of pioglitazone    Patient Self Care Activities:  Marland Kitchen Gather necessary documents needed to apply for medication assistance  Please see past updates related to this goal by clicking on the "Past Updates" button in the selected goal         Follow up/Plan Telephone follow up appointment with care management team member scheduled for: 2 weeks with PharmD  Ruben Reason, PharmD Clinical Pharmacist [place] [phone]

## 2019-04-10 ENCOUNTER — Telehealth: Payer: Self-pay | Admitting: Family Medicine

## 2019-04-10 DIAGNOSIS — B349 Viral infection, unspecified: Secondary | ICD-10-CM | POA: Diagnosis not present

## 2019-04-10 DIAGNOSIS — Z20828 Contact with and (suspected) exposure to other viral communicable diseases: Secondary | ICD-10-CM | POA: Diagnosis not present

## 2019-04-10 NOTE — Telephone Encounter (Signed)
Patient reports she has been tested at [facility] and is negative. Patient reports that she has also been tested at [facility] and is still waiting on result.

## 2019-04-10 NOTE — Telephone Encounter (Signed)
Pt called wanting to know if we knew of a place where she can get the rapid covid test done.  She woke up with a headache, chills and loss of taste and smell.  She lives with her 61 years old dad. She said she has called around to urgent cares and cant get an appt.    Please advise  [phone]  [place]

## 2019-04-12 ENCOUNTER — Ambulatory Visit: Payer: Self-pay | Admitting: Pharmacist

## 2019-04-12 DIAGNOSIS — E1165 Type 2 diabetes mellitus with hyperglycemia: Secondary | ICD-10-CM

## 2019-04-12 NOTE — Chronic Care Management (AMB) (Signed)
  Care Management   Follow Up Note   04/12/2019 Name: Denise Torres MRN: [id number] DOB: July 31, 1955  Subjective Denise Torres is a 63 y.o. year old female who is a primary care patient of Bacigalupo, Dionne Bucy, MD. The care management clinical pharmacist was consulted for diabetes medication assistance and medication management. Follow up outreach today to assess efficacy of pioglitazone. HIPAA identifiers verified.    Goals Addressed            This Visit's Progress   . I can't afford Jardiance or Invokana (pt-stated)       Current Barriers:  . Financial . High deductible health plan ($5500)  Pharmacist Clinical Goal(s): Over the next 14 days, Denise Torres will provide the necessary supplementary documents (proof of out of pocket prescription expenditure, proof of household income) needed for medication assistance applications to CCM pharmacist.   Interventions: . CCM pharmacist will apply for medication assistance program for  o Ascertain patient qualifies for Farxiga medication assistance by [employer] Me . Provide patient with manufacturer coupon to utilize to bring copay down . Updated 10/5: collaboration with [place] pharmacist to verify patient is not using any coupon and there is no further insurance rejection information . Updated 10/12: patient states she has only paid a few hundred dollars into her $5500 deductible, so even the SGLT2 coupons with 1 free month are not an option; patient needs generic due to high deducitble plan, recommend pioglitazone . Updated 10/15: Dr. B approved pioglitazone recommendation; new rx: pioglitazone 15mg  daily; Patient educated on purpose, proper use and potential adverse effects of pioglitazone  . Updated 10/29: patient is happy with pioglitazone, no side effects; taking supplemental calcium and Vitamin D to help counteract any decrease in BMD   Patient Self Care Activities:  Marland Kitchen Gather necessary documents needed to apply for  medication assistance  Please see past updates related to this goal by clicking on the "Past Updates" button in the selected goal         Follow up: The patient has been provided with contact information for the care management team and has been advised to call with any health related questions or concerns.   Ruben Reason, PharmD Clinical Pharmacist [place] [phone]

## 2019-04-12 NOTE — Patient Instructions (Signed)
Congratulations! You have met all case management goals! You may call the case management team at any time should you have a question or if you have new case management needs. We are happy to help you! We will let your doctor know that you have met your goals.    Thank you allowing the Chronic Care Management Team to be a part of your care!   Please call a member of the CCM (Chronic Care Management) Team with any questions or case management needs in the future:   Felecia McCray, RN, BSN Nurse Care Coordinator  [phone]  Jaycee Mckellips, PharmD  Clinical Pharmacist  [phone]  Chrystal Land, LCSW [employer] [phone]   

## 2019-05-07 ENCOUNTER — Telehealth: Payer: Self-pay

## 2019-05-07 ENCOUNTER — Ambulatory Visit (INDEPENDENT_AMBULATORY_CARE_PROVIDER_SITE_OTHER): Payer: BC Managed Care – PPO | Admitting: Family Medicine

## 2019-05-07 ENCOUNTER — Other Ambulatory Visit: Payer: Self-pay

## 2019-05-07 ENCOUNTER — Encounter: Payer: Self-pay | Admitting: Family Medicine

## 2019-05-07 VITALS — BP 107/67 | HR 80 | Temp 97.1°F | Wt 141.6 lb

## 2019-05-07 DIAGNOSIS — E1165 Type 2 diabetes mellitus with hyperglycemia: Secondary | ICD-10-CM | POA: Diagnosis not present

## 2019-05-07 LAB — POCT UA - MICROALBUMIN: Microalbumin Ur, POC: 20 mg/L

## 2019-05-07 LAB — POCT GLYCOSYLATED HEMOGLOBIN (HGB A1C): Hemoglobin A1C: 6.9 % — AB (ref 4.0–5.6)

## 2019-05-07 NOTE — Telephone Encounter (Signed)
-----   Message from Virginia Crews, MD sent at 05/07/2019 12:34 PM EST ----- Normal urine microalbumin

## 2019-05-07 NOTE — Progress Notes (Signed)
Patient: Denise Torres Female    DOB: 15-Sep-1955   63 y.o.   MRN: [id number] Visit Date: 05/07/2019  Today's Provider: [name], MD   Chief Complaint  Patient presents with  . Diabetes   Subjective:     HPI  Diabetes Mellitus Type II, Follow-up:   Lab Results  Component Value Date   HGBA1C 6.9 (A) 05/07/2019   HGBA1C 7.3 (A) 02/02/2019   HGBA1C 7.8 (H) 11/03/2018    Last seen for diabetes 3 months ago.  Management since then includes start Invokana 100 mg. She reports good compliance with treatment. She is not having side effects.  Current symptoms include none  Home blood sugar records: not being checked  Episodes of hypoglycemia? no   Current insulin regiment: Is not on insulin Most Recent Eye Exam: not UTD Weight trend: stable Prior visit with dietician: No Current exercise: yard work Current diet habits: in general, a "healthy" diet    Pertinent Labs:    Component Value Date/Time   CHOL 128 11/03/2018 0835   TRIG 81 11/03/2018 0835   HDL 55 11/03/2018 0835   LDLCALC 57 11/03/2018 0835   LDLCALC 133 (H) 03/23/2017 1540   CREATININE 0.68 11/03/2018 0835   CREATININE 0.90 03/23/2017 1540    Wt Readings from Last 3 Encounters:  05/07/19 141 lb 9.6 oz (64.2 kg)  02/02/19 141 lb 3.2 oz (64 kg)  11/01/18 145 lb (65.8 kg)    ------------------------------------------------------------------------  No Known Allergies   Current Outpatient Medications:  .  pioglitazone (ACTOS) 15 MG tablet, Take 1 tablet (15 mg total) by mouth daily., Disp: 30 tablet, Rfl: 3 .  simvastatin (ZOCOR) 40 MG tablet, Take 1 tablet (40 mg total) by mouth every evening., Disp: 90 tablet, Rfl: 3 .  metFORMIN (GLUCOPHAGE) 500 MG tablet, Take 2 tablets (1,000 mg total) by mouth 2 (two) times daily., Disp: 120 tablet, Rfl: 6  Review of Systems  Constitutional: Negative.   Respiratory: Negative.   Cardiovascular: Negative.   Musculoskeletal: Negative.      Social History   Tobacco Use  . Smoking status: Current Every Day Smoker    Packs/day: 0.50    Years: 10.00    Pack years: 5.00    Types: Cigarettes  . Smokeless tobacco: Never Used  . Tobacco comment: started smoking around 2008  Substance Use Topics  . Alcohol use: No    Comment: rarely      Objective:   BP 107/67 (BP Location: Right Arm, Patient Position: Sitting, Cuff Size: Normal)   Pulse 80   Temp (!) 97.1 F (36.2 C) (Temporal)   Wt 141 lb 9.6 oz (64.2 kg)   SpO2 96%   BMI 23.56 kg/m  Vitals:   05/07/19 0810  BP: 107/67  Pulse: 80  Temp: (!) 97.1 F (36.2 C)  TempSrc: Temporal  SpO2: 96%  Weight: 141 lb 9.6 oz (64.2 kg)  Body mass index is 23.56 kg/m.   Physical Exam Vitals signs reviewed.  Constitutional:      General: She is not in acute distress.    Appearance: Normal appearance. She is well-developed. She is not diaphoretic.  HENT:     Head: Normocephalic and atraumatic.  Eyes:     General: No scleral icterus.    Conjunctiva/sclera: Conjunctivae normal.  Neck:     Musculoskeletal: Neck supple.     Thyroid: No thyromegaly.  Cardiovascular:     Rate and Rhythm: Normal rate and regular rhythm.  Pulses: Normal pulses.     Heart sounds: Normal heart sounds. No murmur.  Pulmonary:     Effort: Pulmonary effort is normal. No respiratory distress.     Breath sounds: Normal breath sounds. No wheezing, rhonchi or rales.  Musculoskeletal:     Right lower leg: No edema.     Left lower leg: No edema.  Lymphadenopathy:     Cervical: No cervical adenopathy.  Skin:    General: Skin is warm and dry.     Capillary Refill: Capillary refill takes less than 2 seconds.     Findings: No rash.  Neurological:     Mental Status: She is alert and oriented to person, place, and time. Mental status is at baseline.  Psychiatric:        Mood and Affect: Mood normal.        Behavior: Behavior normal.      Results for orders placed or performed in visit on  05/07/19  POCT HgB A1C  Result Value Ref Range   Hemoglobin A1C 6.9 (A) 4.0 - 5.6 %    Diabetic Foot Exam - Simple   Simple Foot Form Diabetic Foot exam was performed with the following findings: Yes 05/07/2019  8:46 AM  Visual Inspection No deformities, no ulcerations, no other skin breakdown bilaterally: Yes Sensation Testing Intact to touch and monofilament testing bilaterally: Yes Pulse Check Posterior Tibialis and Dorsalis pulse intact bilaterally: Yes Comments        Assessment & Plan   Problem List Items Addressed This Visit      Endocrine   T2DM (type 2 diabetes mellitus) ([place]) - Primary    Well controlled Continue metformin and Actos Foot exam today Discussed need for eye exam Urine microalbumin today On statin F/u in 3-6 months       Relevant Orders   POCT HgB A1C (Completed)       Return in about 6 months (around 11/04/2019) for CPE.   The entirety of the information documented in the History of Present Illness, Review of Systems and Physical Exam were personally obtained by me. Portions of this information were initially documented by Tiburcio Pea, CMA and reviewed by me for thoroughness and accuracy.    Genova Kiner, Dionne Bucy, MD MPH [facility]

## 2019-05-07 NOTE — Addendum Note (Signed)
Addended by: Jules Schick on: 05/07/2019 08:59 AM   Modules accepted: Orders

## 2019-05-07 NOTE — Assessment & Plan Note (Signed)
Well controlled Continue metformin and Actos Foot exam today Discussed need for eye exam Urine microalbumin today On statin F/u in 3-6 months

## 2019-05-21 ENCOUNTER — Other Ambulatory Visit: Payer: Self-pay | Admitting: Family Medicine

## 2019-07-15 ENCOUNTER — Other Ambulatory Visit: Payer: Self-pay | Admitting: Family Medicine

## 2019-07-16 MED ORDER — METFORMIN HCL 500 MG PO TABS: 1000.0000 mg | ORAL_TABLET | Freq: Two times a day (BID) | ORAL | 0 refills | Status: DC

## 2019-07-31 ENCOUNTER — Encounter: Payer: BLUE CROSS/BLUE SHIELD | Admitting: Family Medicine

## 2019-08-21 ENCOUNTER — Encounter: Payer: Self-pay | Admitting: Family Medicine

## 2019-08-21 MED ORDER — PIOGLITAZONE HCL 15 MG PO TABS: 15.0000 mg | ORAL_TABLET | Freq: Every day | ORAL | 2 refills | Status: DC

## 2019-08-21 MED ORDER — METFORMIN HCL 500 MG PO TABS: 1000.0000 mg | ORAL_TABLET | Freq: Two times a day (BID) | ORAL | 5 refills | Status: DC

## 2019-10-03 ENCOUNTER — Encounter: Payer: Self-pay | Admitting: Family Medicine

## 2019-10-29 ENCOUNTER — Encounter: Payer: BC Managed Care – PPO | Admitting: Family Medicine

## 2019-10-30 ENCOUNTER — Other Ambulatory Visit: Payer: Self-pay | Admitting: Family Medicine

## 2019-11-02 ENCOUNTER — Encounter: Payer: Self-pay | Admitting: Family Medicine

## 2019-11-02 ENCOUNTER — Ambulatory Visit (INDEPENDENT_AMBULATORY_CARE_PROVIDER_SITE_OTHER): Payer: BC Managed Care – PPO | Admitting: Family Medicine

## 2019-11-02 ENCOUNTER — Other Ambulatory Visit: Payer: Self-pay

## 2019-11-02 VITALS — BP 112/72 | HR 85 | Temp 98.2°F | Ht 65.0 in | Wt 144.0 lb

## 2019-11-02 DIAGNOSIS — E785 Hyperlipidemia, unspecified: Secondary | ICD-10-CM

## 2019-11-02 DIAGNOSIS — Z1211 Encounter for screening for malignant neoplasm of colon: Secondary | ICD-10-CM | POA: Diagnosis not present

## 2019-11-02 DIAGNOSIS — Z0000 Encounter for general adult medical examination without abnormal findings: Secondary | ICD-10-CM | POA: Diagnosis not present

## 2019-11-02 DIAGNOSIS — Z720 Tobacco use: Secondary | ICD-10-CM

## 2019-11-02 DIAGNOSIS — E1165 Type 2 diabetes mellitus with hyperglycemia: Secondary | ICD-10-CM

## 2019-11-02 DIAGNOSIS — Z1231 Encounter for screening mammogram for malignant neoplasm of breast: Secondary | ICD-10-CM

## 2019-11-02 DIAGNOSIS — K635 Polyp of colon: Secondary | ICD-10-CM

## 2019-11-02 NOTE — Assessment & Plan Note (Signed)
3-5 minute discussion regarding the harms of tobacco use, the benefits of cessation, and methods of cessation Discussed that there are medication options to help with cessation Patient is currently precontemplative  Will reassess at next visit  

## 2019-11-02 NOTE — Progress Notes (Signed)
I,Laura E Walsh,acting as a scribe for [name], MD.,have documented all relevant documentation on the behalf of [name], MD,as directed by  [name], MD while in the presence of [name], MD.   Complete physical exam   Patient: Denise Torres   DOB: [birth year]   64 y.o. Female  MRN: [id number] Visit Date: 11/02/2019  Today's healthcare provider: [name], MD   Chief Complaint  Patient presents with  . Annual Exam   Subjective    Denise Torres is a 64 y.o. female who presents today for a complete physical exam.  She reports consuming a general diet. The patient has a physically strenuous job, but has no regular exercise apart from work.  She generally feels well. She reports sleeping well. She does not have additional problems to discuss today.  HPI    Past Medical History:  Diagnosis Date  . Arthritis    mostly in b/l hands, left knee  . Blood transfusion without reported diagnosis   . Diabetes mellitus type 2, controlled, without complications ([place] [place]) [id number]  . Diabetes mellitus without complication ([place])   . Hyperlipidemia   . Lower GI bleed 09/2015   Past Surgical History:  Procedure Laterality Date  . EYE SURGERY Bilateral 02/21/1998   lasik  . LAPAROSCOPIC SUPRACERVICAL HYSTERECTOMY  2010   partial  . TONSILLECTOMY  09/21/1977  . WISDOM TOOTH EXTRACTION Bilateral 1976   Social History   Socioeconomic History  . Marital status: Married    Spouse name: Herbie Baltimore  . Number of children: 2  . Years of education: 16  . Highest education level: Bachelor's degree (e.g., BA, AB, BS)  Occupational History    Employer: [employer]  Tobacco Use  . Smoking status: Current Every Day Smoker    Packs/day: 0.50    Years: 10.00    Pack years: 5.00    Types: Cigarettes  . Smokeless tobacco: Never Used  . Tobacco comment: started smoking around 2008  Substance and Sexual Activity  . Alcohol use: No    Comment:  rarely  . Drug use: No  . Sexual activity: Yes  Other Topics Concern  . Not on file  Social History Narrative  . Not on file   Social Determinants of Health   Financial Resource Strain:   . Difficulty of Paying Living Expenses:   Food Insecurity:   . Worried About Charity fundraiser in the Last Year:   . Arboriculturist in the Last Year:   Transportation Needs:   . Film/video editor (Medical):   Marland Kitchen Lack of Transportation (Non-Medical):   Physical Activity:   . Days of Exercise per Week:   . Minutes of Exercise per Session:   Stress:   . Feeling of Stress :   Social Connections:   . Frequency of Communication with Friends and Family:   . Frequency of Social Gatherings with Friends and Family:   . Attends Religious Services:   . Active Member of Clubs or Organizations:   . Attends Archivist Meetings:   Marland Kitchen Marital Status:   Intimate Partner Violence:   . Fear of Current or Ex-Partner:   . Emotionally Abused:   Marland Kitchen Physically Abused:   . Sexually Abused:    Family Status  Relation Name Status  . Mother  Alive  . Father  Alive  . Brother  Alive  . Brother  Deceased   Family History  Problem Relation Age  of Onset  . Breast cancer Mother 51       s/p masectomy  . Diabetes Mother   . Atrial fibrillation Mother   . Diabetes Father   . Heart attack Father 33  . Stroke Father   . Rheum arthritis Brother   . Alcohol abuse Brother   . Drug abuse Brother    No Known Allergies  Patient Care Team: Virginia Crews, MD as PCP - General (Family Medicine)   Medications: Outpatient Medications Prior to Visit  Medication Sig  . metFORMIN (GLUCOPHAGE) 500 MG tablet Take 2 tablets (1,000 mg total) by mouth 2 (two) times daily.  . pioglitazone (ACTOS) 15 MG tablet Take 1 tablet (15 mg total) by mouth daily.  . simvastatin (ZOCOR) 40 MG tablet TAKE 1 TABLET BY MOUTH ONCE DAILY IN THE EVENING   No facility-administered medications prior to visit.    Review  of Systems  Constitutional: Negative.   HENT: Negative.   Eyes: Negative.   Respiratory: Negative.   Cardiovascular: Negative.   Gastrointestinal: Negative.   Endocrine: Negative.   Genitourinary: Negative.   Musculoskeletal: Negative.   Skin: Negative.   Allergic/Immunologic: Negative.   Neurological: Negative.   Hematological: Negative.   Psychiatric/Behavioral: Negative.     Last CBC Lab Results  Component Value Date   WBC 5.3 03/23/2017   HGB 14.0 03/23/2017   HCT 39.9 03/23/2017   MCV 90.1 03/23/2017   MCH 31.6 03/23/2017   RDW 12.1 03/23/2017   PLT 202 [id number]   Last metabolic panel Lab Results  Component Value Date   GLUCOSE 173 (H) 11/03/2018   NA 142 11/03/2018   K 4.2 11/03/2018   CL 103 11/03/2018   CO2 22 11/03/2018   BUN 9 11/03/2018   CREATININE 0.68 11/03/2018   GFRNONAA 95 11/03/2018   GFRAA 110 11/03/2018   CALCIUM 9.4 11/03/2018   PROT 6.7 11/03/2018   ALBUMIN 4.9 11/03/2018   LABGLOB 1.8 11/03/2018   AGRATIO 2.7 (H) 11/03/2018   BILITOT 0.3 11/03/2018   ALKPHOS 90 11/03/2018   AST 38 11/03/2018   ALT 67 (H) 11/03/2018   Last lipids Lab Results  Component Value Date   CHOL 128 11/03/2018   HDL 55 11/03/2018   LDLCALC 57 11/03/2018   TRIG 81 11/03/2018   CHOLHDL 2.3 11/03/2018   Last hemoglobin A1c Lab Results  Component Value Date   HGBA1C 6.9 (A) 05/07/2019   Last thyroid functions No results found for: TSH, T3TOTAL, T4TOTAL, THYROIDAB Last vitamin D No results found for: 25OHVITD2, 25OHVITD3, VD25OH Last vitamin B12 and Folate No results found for: VITAMINB12, FOLATE  Objective    BP 112/72 (BP Location: Left Arm, Patient Position: Sitting, Cuff Size: Large)   Pulse 85   Temp 98.2 F (36.8 C) (Temporal)   Ht 5\' 5"  (1.651 m)   Wt 144 lb (65.3 kg)   SpO2 96%   BMI 23.96 kg/m  BP Readings from Last 3 Encounters:  11/02/19 112/72  05/07/19 107/67  02/02/19 117/74    Physical Exam Vitals and nursing note  reviewed.  Constitutional:      Appearance: Normal appearance. She is well-developed.  HENT:     Head: Normocephalic and atraumatic.     Right Ear: Tympanic membrane, ear canal and external ear normal.     Left Ear: Tympanic membrane, ear canal and external ear normal.     Mouth/Throat:     Pharynx: Uvula midline.  Eyes:     General: Lids  are normal.     Conjunctiva/sclera: Conjunctivae normal.     Pupils: Pupils are equal, round, and reactive to light.  Neck:     Thyroid: No thyroid mass or thyromegaly.     Vascular: No carotid bruit.     Trachea: Trachea normal.  Cardiovascular:     Rate and Rhythm: Normal rate and regular rhythm.     Pulses: Normal pulses.     Heart sounds: Normal heart sounds. No murmur.  Pulmonary:     Effort: Pulmonary effort is normal. No respiratory distress.     Breath sounds: Normal breath sounds. No wheezing.  Chest:     Comments: Breasts: breasts appear normal, no suspicious masses, no skin or nipple changes or axillary nodes.  Abdominal:     General: Bowel sounds are normal.     Palpations: Abdomen is soft.     Tenderness: There is no abdominal tenderness.  Musculoskeletal:        General: Normal range of motion.     Cervical back: Normal range of motion and neck supple.     Right lower leg: No edema.     Left lower leg: No edema.  Lymphadenopathy:     Cervical: No cervical adenopathy.  Skin:    General: Skin is warm and dry.     Findings: No rash.  Neurological:     Mental Status: She is alert and oriented to person, place, and time. Mental status is at baseline.     Cranial Nerves: No cranial nerve deficit.  Psychiatric:        Mood and Affect: Mood normal.        Speech: Speech normal.        Behavior: Behavior normal.        Thought Content: Thought content normal.       Depression Screen  PHQ 2/9 Scores 11/02/2019 07/28/2018 07/13/2017  PHQ - 2 Score 0 3 0  PHQ- 9 Score 0 4 -    No results found for any visits on 11/02/19.   Assessment & Plan    Routine Health Maintenance and Physical Exam  Exercise Activities and Dietary recommendations Goals   None     Immunization History  Administered Date(s) Administered  . Influenza,inj,Quad PF,6+ Mos 03/23/2017, 04/19/2018, 02/02/2019  . PFIZER SARS-COV-2 Vaccination 09/03/2019, 10/01/2019  . Pneumococcal Polysaccharide-23 04/19/2018  . Tdap 09/20/2009, 03/30/2014    Health Maintenance  Topic Date Due  . OPHTHALMOLOGY EXAM  04/06/2018  . COLONOSCOPY  10/03/2018  . HEMOGLOBIN A1C  11/04/2019  . INFLUENZA VACCINE  01/13/2020  . FOOT EXAM  05/06/2020  . URINE MICROALBUMIN  05/06/2020  . MAMMOGRAM  08/09/2020  . TETANUS/TDAP  03/30/2024  . PNEUMOCOCCAL POLYSACCHARIDE VACCINE AGE 65-64 HIGH RISK  Completed  . COVID-19 Vaccine  Completed  . Hepatitis C Screening  Completed  . HIV Screening  Completed    Discussed health benefits of physical activity, and encouraged her to engage in regular exercise appropriate for her age and condition.  Problem List Items Addressed This Visit      Digestive   Colon polyps   Relevant Orders   Ambulatory referral to Gastroenterology     Endocrine   T2DM (type 2 diabetes mellitus) ([place])    Well controlled with last A1C 6.9 Will recheck A1C today Continue current medications UTD on vaccines, foot exam, but is do for an eye exam Not on  ACEi On Statin Discussed diet and exercise F/u in 6 months  Relevant Orders   Hemoglobin A1c   Comprehensive metabolic panel     Other   HLD (hyperlipidemia)    Previously well controlled Continue statin Repeat FLP and CMP Goal LDL < 70       Relevant Orders   Lipid panel   Tobacco abuse    3-5 minute discussion regarding the harms of tobacco use, the benefits of cessation, and methods of cessation Discussed that there are medication options to help with cessation Patient is currently precontemplative  Will reassess at next visit       Annual physical exam -  Primary   Relevant Orders   Hemoglobin A1c   Comprehensive metabolic panel   Lipid panel    Other Visit Diagnoses    Breast cancer screening by mammogram       Relevant Orders   MM 3D SCREEN BREAST BILATERAL       Return in about 6 months (around 05/04/2020) for Chronic illnesses.     I, [name], MD, have reviewed all documentation for this visit. The documentation on 11/02/19 for the exam, diagnosis, procedures, and orders are all accurate and complete.   James Senn, Dionne Bucy, MD, MPH [facility]

## 2019-11-02 NOTE — Assessment & Plan Note (Signed)
Well controlled with last A1C 6.9 Will recheck A1C today Continue current medications UTD on vaccines, foot exam, but is do for an eye exam Not on  ACEi On Statin Discussed diet and exercise F/u in 6 months

## 2019-11-02 NOTE — Patient Instructions (Signed)

## 2019-11-02 NOTE — Assessment & Plan Note (Signed)
Previously well controlled Continue statin Repeat FLP and CMP Goal LDL < 70 

## 2019-11-03 LAB — COMPREHENSIVE METABOLIC PANEL
ALT: 10 IU/L (ref 0–32)
AST: 13 IU/L (ref 0–40)
Albumin/Globulin Ratio: 2.7 — ABNORMAL HIGH (ref 1.2–2.2)
Albumin: 4.9 g/dL — ABNORMAL HIGH (ref 3.8–4.8)
Alkaline Phosphatase: 99 IU/L (ref 48–121)
BUN/Creatinine Ratio: 13 (ref 12–28)
BUN: 11 mg/dL (ref 8–27)
Bilirubin Total: 0.2 mg/dL (ref 0.0–1.2)
CO2: 22 mmol/L (ref 20–29)
Calcium: 9.2 mg/dL (ref 8.7–10.3)
Chloride: 103 mmol/L (ref 96–106)
Creatinine, Ser: 0.82 mg/dL (ref 0.57–1.00)
GFR calc Af Amer: 89 mL/min/{1.73_m2} (ref >59)
GFR calc non Af Amer: 77 mL/min/{1.73_m2} (ref >59)
Globulin, Total: 1.8 g/dL (ref 1.5–4.5)
Glucose: 193 mg/dL — ABNORMAL HIGH (ref 65–99)
Potassium: 3.9 mmol/L (ref 3.5–5.2)
Sodium: 140 mmol/L (ref 134–144)
Total Protein: 6.7 g/dL (ref 6.0–8.5)

## 2019-11-03 LAB — HEMOGLOBIN A1C
Est. average glucose Bld gHb Est-mCnc: 160 mg/dL
Hgb A1c MFr Bld: 7.2 % — ABNORMAL HIGH (ref 4.8–5.6)

## 2019-11-03 LAB — LIPID PANEL
Chol/HDL Ratio: 2.5 ratio (ref 0.0–4.4)
Cholesterol, Total: 156 mg/dL (ref 100–199)
HDL: 63 mg/dL (ref >39)
LDL Chol Calc (NIH): 75 mg/dL (ref 0–99)
Triglycerides: 100 mg/dL (ref 0–149)
VLDL Cholesterol Cal: 18 mg/dL (ref 5–40)

## 2019-11-05 ENCOUNTER — Telehealth: Payer: Self-pay

## 2019-11-05 MED ORDER — ATORVASTATIN CALCIUM 40 MG PO TABS
40.0000 mg | ORAL_TABLET | Freq: Every day | ORAL | 3 refills | Status: DC
Start: 2019-11-05 — End: 2020-05-12

## 2019-11-05 MED ORDER — DAPAGLIFLOZIN PROPANEDIOL 5 MG PO TABS: 5.0000 mg | ORAL_TABLET | Freq: Every day | ORAL | 3 refills | Status: DC

## 2019-11-05 NOTE — Telephone Encounter (Signed)
Patient advised as below. Patient verbalizes understanding and is in agreement with treatment plan.  

## 2019-11-05 NOTE — Telephone Encounter (Signed)
-----   Message from Virginia Crews, MD sent at 11/05/2019  9:33 AM EDT ----- A1c is elevated.  Goal is less than 7.  We had talked about switching from ACtos to [place] when patient had insurance again.  That would help A1c.  If she is ok with it, please d/c Actos and start Jardiance 10mg  daily.  Recheck in 3 months.  Cholesterol is also not to goal of LDL <70.  Recommend changing simvastatin to atorvastatin 40 mg daily. Ok to eRx if patient agrees

## 2019-11-05 NOTE — Telephone Encounter (Signed)
Sent Farxiga 5mg  daily instead of Jardiance

## 2019-11-05 NOTE — Telephone Encounter (Signed)
-----   Message from Virginia Crews, MD sent at 11/05/2019  9:33 AM EDT ----- A1c is elevated.  Goal is less than 7.  We had talked about switching from ACtos to [place] when patient had insurance again.  That would help A1c.  If she is ok with it, please d/c Actos and start Jardiance 10mg  daily.  Recheck in 3 months.  Cholesterol is also not to goal of LDL <70.  Recommend changing simvastatin to atorvastatin 40 mg daily. Ok to eRx if patient agrees

## 2019-12-07 ENCOUNTER — Ambulatory Visit
Admission: RE | Admit: 2019-12-07 | Discharge: 2019-12-07 | Disposition: A | Discharge status: Home / Self Care | Payer: BC Managed Care – PPO | Source: Ambulatory Visit | Attending: Family Medicine | Admitting: Family Medicine

## 2019-12-07 DIAGNOSIS — Z1231 Encounter for screening mammogram for malignant neoplasm of breast: Secondary | ICD-10-CM | POA: Insufficient documentation

## 2019-12-13 ENCOUNTER — Other Ambulatory Visit: Payer: Self-pay

## 2019-12-13 ENCOUNTER — Telehealth (INDEPENDENT_AMBULATORY_CARE_PROVIDER_SITE_OTHER): Payer: Self-pay | Admitting: Gastroenterology

## 2019-12-13 DIAGNOSIS — K635 Polyp of colon: Secondary | ICD-10-CM

## 2019-12-13 NOTE — Progress Notes (Signed)
Gastroenterology Pre-Procedure Review  Request Date: Friday 01/04/20 Requesting Physician: Dr. Marius Ditch  PATIENT REVIEW QUESTIONS: The patient responded to the following health history questions as indicated:    1. Are you having any GI issues? no 2. Do you have a personal history of Polyps? yes (10/03/15 sessile serrated adenomas performed by Dr. Epimenio Foot) 3. Do you have a family history of Colon Cancer or Polyps? no 4. Diabetes Mellitus? yes (oral meds) 5. Joint replacements in the past 12 months?no 6. Major health problems in the past 3 months?no 7. Any artificial heart valves, MVP, or defibrillator?no    MEDICATIONS & ALLERGIES:    Patient reports the following regarding taking any anticoagulation/antiplatelet therapy:   Plavix, Coumadin, Eliquis, Xarelto, Lovenox, Pradaxa, Brilinta, or Effient? no Aspirin? no  Patient confirms/reports the following medications:  Current Outpatient Medications  Medication Sig Dispense Refill  . atorvastatin (LIPITOR) 40 MG tablet Take 1 tablet (40 mg total) by mouth daily. 90 tablet 3  . dapagliflozin propanediol (FARXIGA) 5 MG TABS tablet Take 1 tablet (5 mg total) by mouth daily before breakfast. 30 tablet 3  . metFORMIN (GLUCOPHAGE) 500 MG tablet Take 2 tablets (1,000 mg total) by mouth 2 (two) times daily. 120 tablet 5   No current facility-administered medications for this visit.    Patient confirms/reports the following allergies:  No Known Allergies  No orders of the defined types were placed in this encounter.   AUTHORIZATION INFORMATION Primary Insurance: 1D#: Group #:  Secondary Insurance: 1D#: Group #:  SCHEDULE INFORMATION: Date: 07/23/21Time: Location:[facility]

## 2019-12-21 ENCOUNTER — Telehealth: Payer: Self-pay

## 2019-12-21 NOTE — Telephone Encounter (Signed)
Patient advised as below.  

## 2019-12-21 NOTE — Telephone Encounter (Signed)
-----   Message from Mar Daring, [place] sent at 12/21/2019  1:11 PM EDT ----- Normal mammogram. Repeat screening in one year.

## 2019-12-25 DIAGNOSIS — Z20822 Contact with and (suspected) exposure to covid-19: Secondary | ICD-10-CM | POA: Diagnosis not present

## 2019-12-25 DIAGNOSIS — J069 Acute upper respiratory infection, unspecified: Secondary | ICD-10-CM | POA: Diagnosis not present

## 2020-01-02 ENCOUNTER — Other Ambulatory Visit
Admission: RE | Admit: 2020-01-02 | Discharge: 2020-01-02 | Disposition: A | Discharge status: Home / Self Care | Payer: BC Managed Care – PPO | Source: Ambulatory Visit | Attending: Gastroenterology | Admitting: Gastroenterology

## 2020-01-02 ENCOUNTER — Other Ambulatory Visit: Payer: Self-pay

## 2020-01-02 DIAGNOSIS — Z01812 Encounter for preprocedural laboratory examination: Secondary | ICD-10-CM | POA: Insufficient documentation

## 2020-01-02 DIAGNOSIS — Z20822 Contact with and (suspected) exposure to covid-19: Secondary | ICD-10-CM | POA: Diagnosis not present

## 2020-01-02 LAB — SARS CORONAVIRUS 2 (TAT 6-24 HRS): SARS Coronavirus 2: NEGATIVE

## 2020-01-04 ENCOUNTER — Other Ambulatory Visit: Payer: Self-pay

## 2020-01-04 ENCOUNTER — Ambulatory Visit
Admission: RE | Admit: 2020-01-04 | Discharge: 2020-01-04 | Disposition: A | Discharge status: Home / Self Care | Payer: BC Managed Care – PPO | Attending: Gastroenterology | Admitting: Gastroenterology

## 2020-01-04 ENCOUNTER — Encounter: Payer: Self-pay | Admitting: Gastroenterology

## 2020-01-04 ENCOUNTER — Ambulatory Visit: Payer: BC Managed Care – PPO | Admitting: Anesthesiology

## 2020-01-04 ENCOUNTER — Encounter
Admission: RE | Disposition: A | Discharge status: Home / Self Care | Payer: Self-pay | Source: Home / Self Care | Attending: Gastroenterology

## 2020-01-04 DIAGNOSIS — E785 Hyperlipidemia, unspecified: Secondary | ICD-10-CM | POA: Diagnosis not present

## 2020-01-04 DIAGNOSIS — Z09 Encounter for follow-up examination after completed treatment for conditions other than malignant neoplasm: Secondary | ICD-10-CM | POA: Diagnosis not present

## 2020-01-04 DIAGNOSIS — K635 Polyp of colon: Secondary | ICD-10-CM

## 2020-01-04 DIAGNOSIS — Z7984 Long term (current) use of oral hypoglycemic drugs: Secondary | ICD-10-CM | POA: Diagnosis not present

## 2020-01-04 DIAGNOSIS — D122 Benign neoplasm of ascending colon: Secondary | ICD-10-CM | POA: Diagnosis not present

## 2020-01-04 DIAGNOSIS — E119 Type 2 diabetes mellitus without complications: Secondary | ICD-10-CM | POA: Diagnosis not present

## 2020-01-04 DIAGNOSIS — Z79899 Other long term (current) drug therapy: Secondary | ICD-10-CM | POA: Insufficient documentation

## 2020-01-04 DIAGNOSIS — D124 Benign neoplasm of descending colon: Secondary | ICD-10-CM | POA: Diagnosis not present

## 2020-01-04 DIAGNOSIS — F17210 Nicotine dependence, cigarettes, uncomplicated: Secondary | ICD-10-CM | POA: Insufficient documentation

## 2020-01-04 DIAGNOSIS — Z86010 Personal history of colonic polyps: Secondary | ICD-10-CM | POA: Diagnosis not present

## 2020-01-04 HISTORY — PX: COLONOSCOPY WITH PROPOFOL: SHX5780

## 2020-01-04 LAB — GLUCOSE, CAPILLARY: Glucose-Capillary: 164 mg/dL — ABNORMAL HIGH (ref 70–99)

## 2020-01-04 SURGERY — COLONOSCOPY WITH PROPOFOL
Anesthesia: General

## 2020-01-04 MED ORDER — PROPOFOL 500 MG/50ML IV EMUL
INTRAVENOUS | Status: DC | PRN
  Administered 2020-01-04: 125 ug/kg/min via INTRAVENOUS

## 2020-01-04 MED ORDER — LIDOCAINE HCL (PF) 2 % IJ SOLN
INTRAMUSCULAR | Status: AC
  Filled 2020-01-04: qty 5

## 2020-01-04 MED ORDER — PROPOFOL 10 MG/ML IV BOLUS
INTRAVENOUS | Status: DC | PRN
  Administered 2020-01-04: 70 mg via INTRAVENOUS

## 2020-01-04 MED ORDER — PROPOFOL 500 MG/50ML IV EMUL
INTRAVENOUS | Status: AC
  Filled 2020-01-04: qty 50

## 2020-01-04 MED ORDER — SODIUM CHLORIDE 0.9 % IV SOLN
INTRAVENOUS | Status: DC
  Administered 2020-01-04: 1000 mL via INTRAVENOUS

## 2020-01-04 MED ORDER — LIDOCAINE HCL (CARDIAC) PF 100 MG/5ML IV SOSY
PREFILLED_SYRINGE | INTRAVENOUS | Status: DC | PRN
  Administered 2020-01-04: 30 mg via INTRAVENOUS

## 2020-01-04 NOTE — Anesthesia Preprocedure Evaluation (Signed)
Anesthesia Evaluation  Patient identified by MRN, date of birth, ID band Patient awake    Reviewed: Allergy & Precautions, H&P , NPO status , Patient's Chart, lab work & pertinent test results, reviewed documented beta blocker date and time   Airway Mallampati: I  TM Distance: >3 FB Neck ROM: full    Dental  (+) Dental Advidsory Given, Caps, Teeth Intact   Pulmonary neg shortness of breath, neg COPD, Recent URI , Residual Cough, Current Smoker,    Pulmonary exam normal breath sounds clear to auscultation       Cardiovascular Exercise Tolerance: Good negative cardio ROS Normal cardiovascular exam Rhythm:regular Rate:Normal     Neuro/Psych negative neurological ROS  negative psych ROS   GI/Hepatic negative GI ROS, Neg liver ROS,   Endo/Other  diabetes, Well Controlled, Type 2, Oral Hypoglycemic Agents  Renal/GU negative Renal ROS  negative genitourinary   Musculoskeletal   Abdominal   Peds  Hematology negative hematology ROS (+)   Anesthesia Other Findings Past Medical History: No date: Arthritis     Comment:  mostly in b/l hands, left knee No date: Blood transfusion without reported diagnosis 10/11/2015: Diabetes mellitus type 2, controlled, without  complications ([facility]) No date: Diabetes mellitus without complication ([facility]) No date: Hyperlipidemia 09/2015: Lower GI bleed   Reproductive/Obstetrics negative OB ROS                             Anesthesia Physical Anesthesia Plan  ASA: II  Anesthesia Plan: General   Post-op Pain Management:    Induction: Intravenous  PONV Risk Score and Plan: 2 and Propofol infusion and TIVA  Airway Management Planned: Natural Airway and Nasal Cannula  Additional Equipment:   Intra-op Plan:   Post-operative Plan:   Informed Consent: I have reviewed the patients History and Physical, chart, labs and discussed the procedure including the risks,  benefits and alternatives for the proposed anesthesia with the patient or authorized representative who has indicated his/her understanding and acceptance.     Dental Advisory Given  Plan Discussed with: Anesthesiologist, CRNA and Surgeon  Anesthesia Plan Comments:         Anesthesia Quick Evaluation

## 2020-01-04 NOTE — H&P (Signed)
**Note Denise-Identified via Obfuscation** Jonathon Bellows, MD [address], [place], [place], [place], [address] [id number], [place], [place], [place], [address] Phone: [phone]  Fax: [phone]  Primary Care Physician:  Virginia Crews, MD   Pre-Procedure History & Physical: HPI:  Denise Torres is a 64 y.o. female is here for an colonoscopy.   Past Medical History:  Diagnosis Date  . Arthritis    mostly in b/l hands, left knee  . Blood transfusion without reported diagnosis   . Diabetes mellitus type 2, controlled, without complications ([place]) 09/21/7351  . Diabetes mellitus without complication ([place])   . Hyperlipidemia   . Lower GI bleed 09/2015    Past Surgical History:  Procedure Laterality Date  . ABDOMINAL HYSTERECTOMY    . DIAGNOSTIC LAPAROSCOPY    . EYE SURGERY Bilateral 02/21/1998   lasik  . LAPAROSCOPIC SUPRACERVICAL HYSTERECTOMY  2010   partial  . TONSILLECTOMY  09/21/1977  . WISDOM TOOTH EXTRACTION Bilateral 1976    Prior to Admission medications   Medication Sig Start Date End Date Taking? Authorizing Provider  atorvastatin (LIPITOR) 40 MG tablet Take 1 tablet (40 mg total) by mouth daily. 11/05/19  Yes Bacigalupo, Dionne Bucy, MD  dapagliflozin propanediol (FARXIGA) 5 MG TABS tablet Take 1 tablet (5 mg total) by mouth daily before breakfast. 11/05/19  Yes Bacigalupo, Dionne Bucy, MD  metFORMIN (GLUCOPHAGE) 500 MG tablet Take 2 tablets (1,000 mg total) by mouth 2 (two) times daily. 08/21/19  Yes Virginia Crews, MD    Allergies as of 12/13/2019  . (No Known Allergies)    Family History  Problem Relation Age of Onset  . Breast cancer Mother 8       s/p masectomy  . Diabetes Mother   . Atrial fibrillation Mother   . Diabetes Father   . Heart attack Father 75  . Stroke Father   . Rheum arthritis Brother   . Alcohol abuse Brother   . Drug abuse Brother     Social History   Socioeconomic History  . Marital status: Married    Spouse name: Herbie Baltimore  . Number of children: 2  . Years  of education: 16  . Highest education level: Bachelor's degree (e.g., BA, AB, BS)  Occupational History    Employer: [employer]  Tobacco Use  . Smoking status: Current Every Day Smoker    Packs/day: 0.50    Years: 10.00    Pack years: 5.00    Types: Cigarettes  . Smokeless tobacco: Never Used  . Tobacco comment: started smoking around 2008  Vaping Use  . Vaping Use: Never used  Substance and Sexual Activity  . Alcohol use: No    Comment: rarely  . Drug use: No  . Sexual activity: Yes  Other Topics Concern  . Not on file  Social History Narrative  . Not on file   Social Determinants of Health   Financial Resource Strain:   . Difficulty of Paying Living Expenses:   Food Insecurity:   . Worried About Charity fundraiser in the Last Year:   . Arboriculturist in the Last Year:   Transportation Needs:   . Film/video editor (Medical):   Marland Kitchen Lack of Transportation (Non-Medical):   Physical Activity:   . Days of Exercise per Week:   . Minutes of Exercise per Session:   Stress:   . Feeling of Stress :   Social Connections:   . Frequency of Communication with Friends and Family:   .  Frequency of Social Gatherings with Friends and Family:   . Attends Religious Services:   . Active Member of Clubs or Organizations:   . Attends Archivist Meetings:   Marland Kitchen Marital Status:   Intimate Partner Violence:   . Fear of Current or Ex-Partner:   . Emotionally Abused:   Marland Kitchen Physically Abused:   . Sexually Abused:     Review of Systems: See HPI, otherwise negative ROS  Physical Exam: BP 116/79   Pulse 86   Temp (!) 97 F (36.1 C) (Temporal)   Resp 16   Ht 5\' 5"  (1.651 m)   Wt 63.6 kg   SpO2 96%   BMI 23.34 kg/m  General:   Alert,  pleasant and cooperative in NAD Head:  Normocephalic and atraumatic. Neck:  Supple; no masses or thyromegaly. Lungs:  Clear throughout to auscultation, normal respiratory effort.    Heart:  +S1, +S2, Regular rate and rhythm,  No edema. Abdomen:  Soft, nontender and nondistended. Normal bowel sounds, without guarding, and without rebound.   Neurologic:  Alert and  oriented x4;  grossly normal neurologically.  Impression/Plan: Denise Torres is here for an colonoscopy to be performed for surveillance due to prior history of colon polyps   Risks, benefits, limitations, and alternatives regarding  colonoscopy have been reviewed with the patient.  Questions have been answered.  All parties agreeable.   Jonathon Bellows, MD  01/04/2020, 9:23 AM

## 2020-01-04 NOTE — Op Note (Signed)
[facility] Gastroenterology Patient Name: Mateya Torti Procedure Date: 01/04/2020 9:27 AM MRN: [id number] Account #: [id number] Date of Birth: Oct 23, 1955 Admit Type: Outpatient Age: 64 Room: [facility] ENDO ROOM 2 Gender: Female Note Status: Finalized Procedure:             Colonoscopy Indications:           High risk colon cancer surveillance: Personal history                         of colonic polyps Providers:             Jonathon Bellows MD, MD Referring MD:          Dionne Bucy. Bacigalupo (Referring MD) Medicines:             Monitored Anesthesia Care Complications:         No immediate complications. Procedure:             Pre-Anesthesia Assessment:                        - Prior to the procedure, a History and Physical was                         performed, and patient medications, allergies and                         sensitivities were reviewed. The patient's tolerance                         of previous anesthesia was reviewed.                        - The risks and benefits of the procedure and the                         sedation options and risks were discussed with the                         patient. All questions were answered and informed                         consent was obtained.                        - ASA Grade Assessment: II - A patient with mild                         systemic disease.                        After obtaining informed consent, the colonoscope was                         passed under direct vision. Throughout the procedure,                         the patient's blood pressure, pulse, and oxygen                         saturations were monitored continuously. The  Colonoscope was introduced through the anus and                         advanced to the the cecum, identified by the                         appendiceal orifice. The colonoscopy was performed                         with ease. The patient tolerated the  procedure well.                         The quality of the bowel preparation was adequate. Findings:      The perianal and digital rectal examinations were normal.      A 12 mm polyp was found in the proximal ascending colon. The polyp was       sessile. The polyp was removed with a cold snare. Resection and       retrieval were complete.      Four sessile polyps were found in the descending colon. The polyps were       4 to 6 mm in size. These polyps were removed with a cold snare.       Resection and retrieval were complete.      The exam was otherwise without abnormality on direct and retroflexion       views. Impression:            - One 12 mm polyp in the proximal ascending colon,                         removed with a cold snare. Resected and retrieved.                        - Four 4 to 6 mm polyps in the descending colon,                         removed with a cold snare. Resected and retrieved.                        - The examination was otherwise normal on direct and                         retroflexion views. Recommendation:        - Discharge patient to home (with escort).                        - Resume previous diet.                        - Continue present medications.                        - Await pathology results.                        - Repeat colonoscopy in 3 years for surveillance. Procedure Code(s):     --- Professional ---  45385, Colonoscopy, flexible; with removal of                         tumor(s), polyp(s), or other lesion(s) by snare                         technique Diagnosis Code(s):     --- Professional ---                        K63.5, Polyp of colon                        Z86.010, Personal history of colonic polyps CPT copyright 2019 American Medical Association. All rights reserved. The codes documented in this report are preliminary and upon coder review may  be revised to meet current compliance requirements. Jonathon Bellows,  MD Jonathon Bellows MD, MD 01/04/2020 10:12:54 AM This report has been signed electronically. Number of Addenda: 0 Note Initiated On: 01/04/2020 9:27 AM Scope Withdrawal Time: 0 hours 28 minutes 34 seconds  Total Procedure Duration: 0 hours 31 minutes 51 seconds  Estimated Blood Loss:  Estimated blood loss: none.      [facility]

## 2020-01-04 NOTE — Anesthesia Postprocedure Evaluation (Signed)
Anesthesia Post Note  Patient: Denise Torres  Procedure(s) Performed: COLONOSCOPY WITH PROPOFOL (N/A )  Patient location during evaluation: Endoscopy Anesthesia Type: General Level of consciousness: awake and alert Pain management: pain level controlled Vital Signs Assessment: post-procedure vital signs reviewed and stable Respiratory status: spontaneous breathing, nonlabored ventilation, respiratory function stable and patient connected to nasal cannula oxygen Cardiovascular status: blood pressure returned to baseline and stable Postop Assessment: no apparent nausea or vomiting Anesthetic complications: no   No complications documented.   Last Vitals:  Vitals:   01/04/20 1023 01/04/20 1033  BP: 95/71 121/81  Pulse: 79 78  Resp: (!) 25 16  Temp:    SpO2: 99% 97%    Last Pain:  Vitals:   01/04/20 1033  TempSrc:   PainSc: 0-No pain                 Martha Clan

## 2020-01-04 NOTE — Transfer of Care (Signed)
Immediate Anesthesia Transfer of Care Note  Patient: Denise Torres  Procedure(s) Performed: COLONOSCOPY WITH PROPOFOL (N/A )  Patient Location: PACU and Endoscopy Unit  Anesthesia Type:General  Level of Consciousness: awake  Airway & Oxygen Therapy: Patient Spontanous Breathing  Post-op Assessment: Report given to RN  Post vital signs: stable  Last Vitals:  Vitals Value Taken Time  BP 108/68 01/04/20 1013  Temp 35.7 C 01/04/20 1013  Pulse 64 01/04/20 1015  Resp 19 01/04/20 1015  SpO2 100 % 01/04/20 1015  Vitals shown include unvalidated device data.  Last Pain:  Vitals:   01/04/20 1013  TempSrc: Temporal  PainSc: Asleep         Complications: No complications documented.

## 2020-01-07 ENCOUNTER — Encounter: Payer: Self-pay | Admitting: Gastroenterology

## 2020-01-08 LAB — SURGICAL PATHOLOGY

## 2020-01-09 ENCOUNTER — Telehealth: Payer: Self-pay

## 2020-01-09 DIAGNOSIS — K635 Polyp of colon: Secondary | ICD-10-CM

## 2020-01-09 NOTE — Telephone Encounter (Signed)
-----   Message from Jonathon Bellows, MD sent at 01/08/2020 11:04 PM EDT ----- Sherald Hess please   Inform multiple pre cancerous polyps- due to prior history of sessile serrated polyps- suggest genetics referral to rule out sessile serrated polyposis syndrome- if agreeable please refer.Repeat colonoscopy in 3 years  C.c Bacigalupo, Dionne Bucy, MD   Dr Jonathon Bellows MD,MRCP [facility]) Gastroenterology/Hepatology Pager: [phone]

## 2020-01-09 NOTE — Telephone Encounter (Signed)
Patient verbalized understanding. She states she would like referral and will repeat colonoscopy in 3 years. Place referral and set up recall

## 2020-01-13 LAB — HM DIABETES EYE EXAM

## 2020-01-25 ENCOUNTER — Encounter: Payer: Self-pay | Admitting: Family Medicine

## 2020-02-11 IMAGING — MG DIGITAL SCREENING BILATERAL MAMMOGRAM WITH TOMO AND CAD
8 series · 9 of 24 positions shown · non-contrast
Comparison: Previous exam(s).

CLINICAL DATA: Screening.

EXAM:
DIGITAL SCREENING BILATERAL MAMMOGRAM WITH TOMO AND CAD

[L MLO synth-2D]
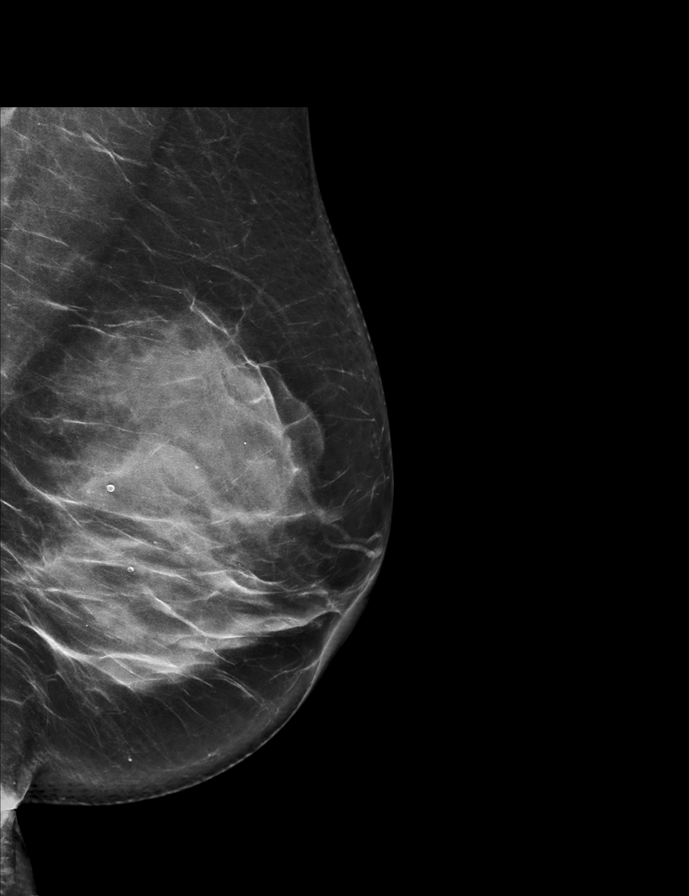

[L CC synth-2D]
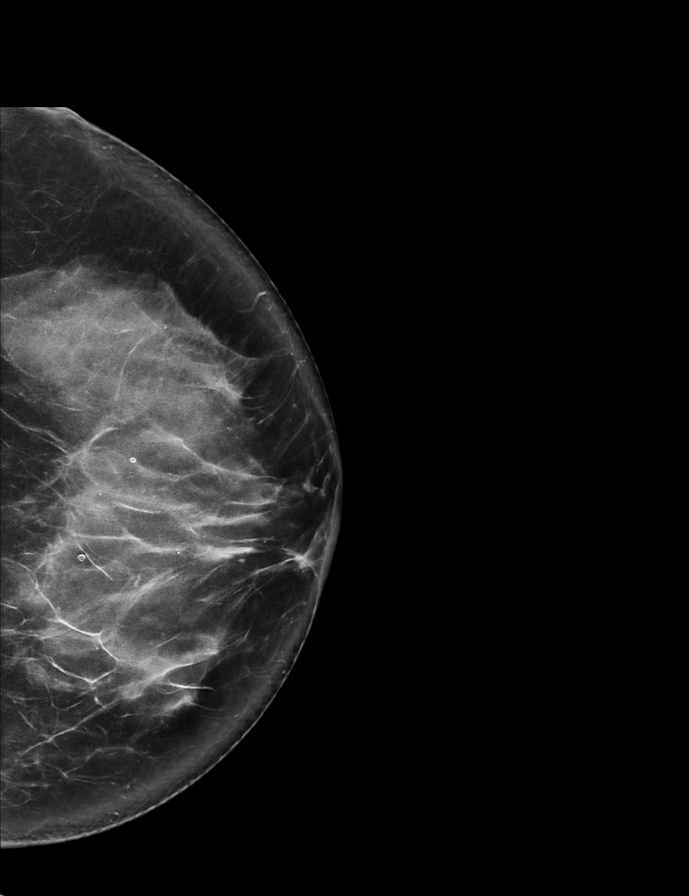

[R CC synth-2D]
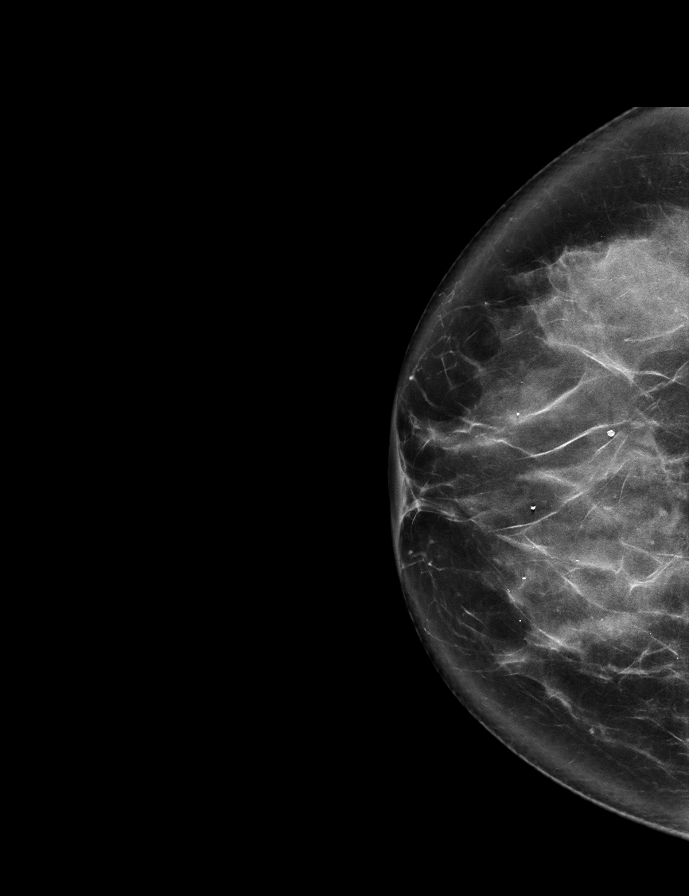

[R MLO synth-2D]
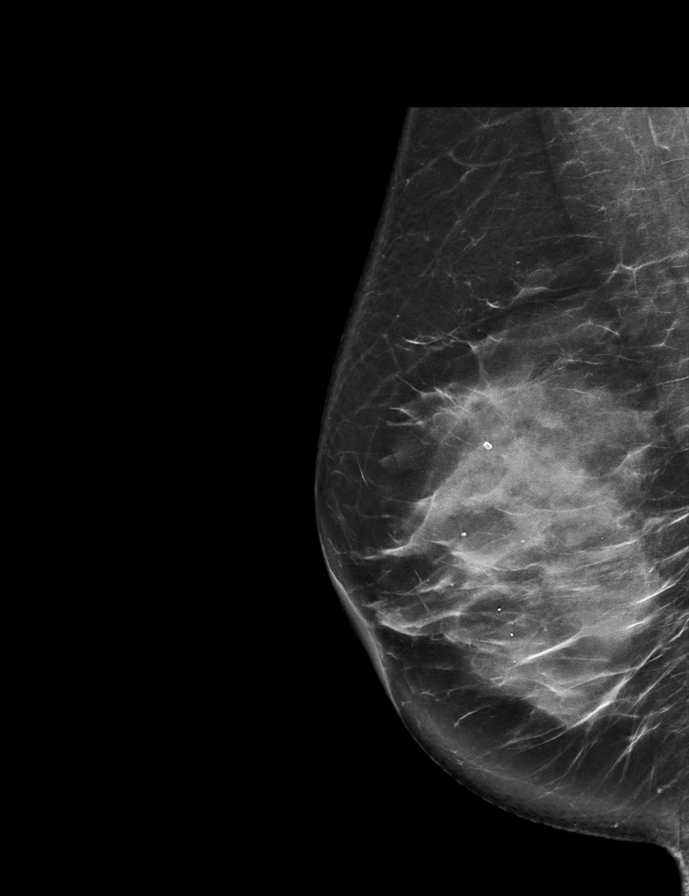

[L MLO tomo · 2 of 78 frames shown]
[frame 26/78]
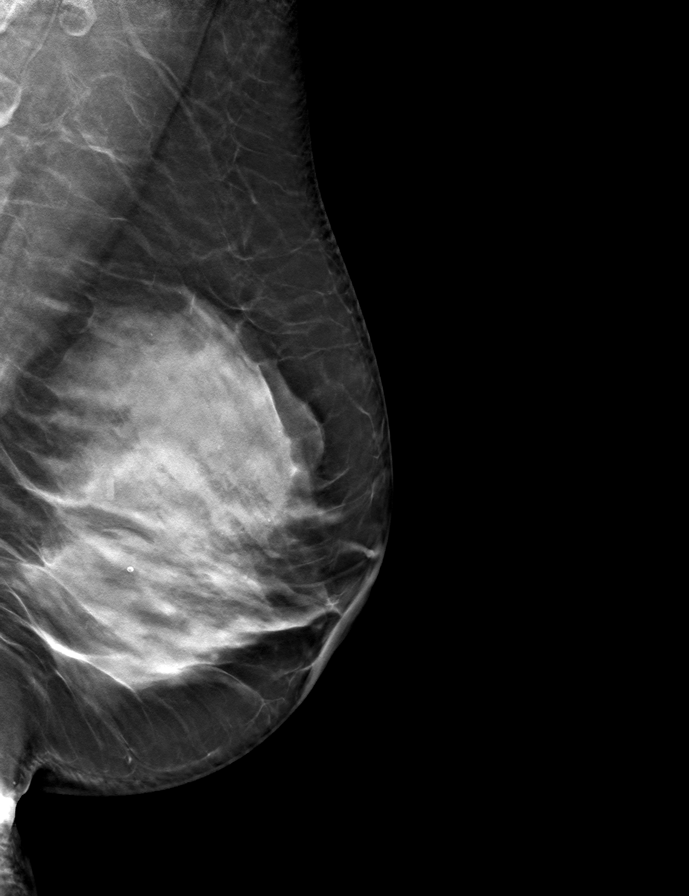
[frame 39/78]
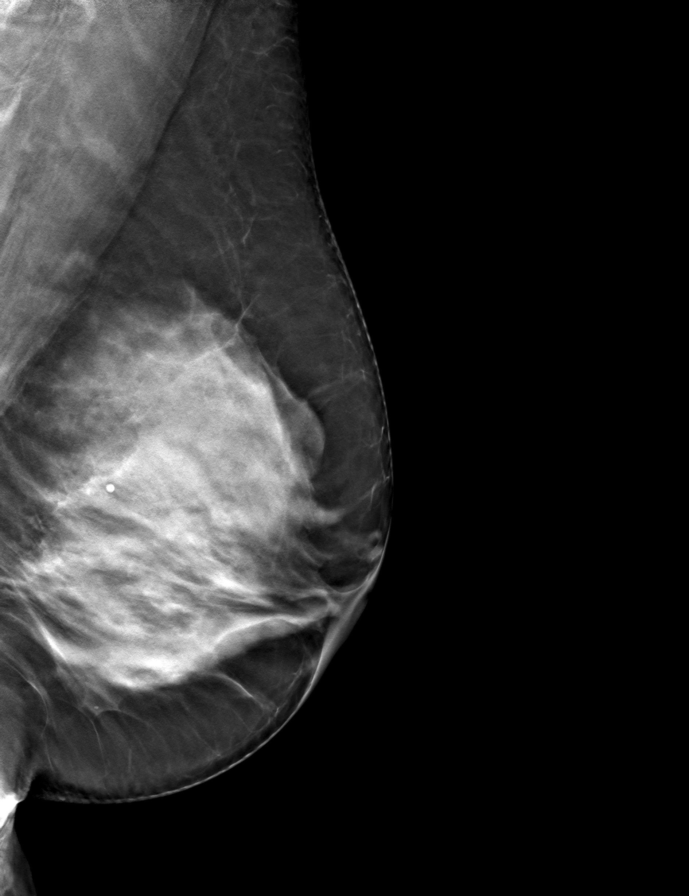

[R CC tomo · tomo slice 41/81.0]
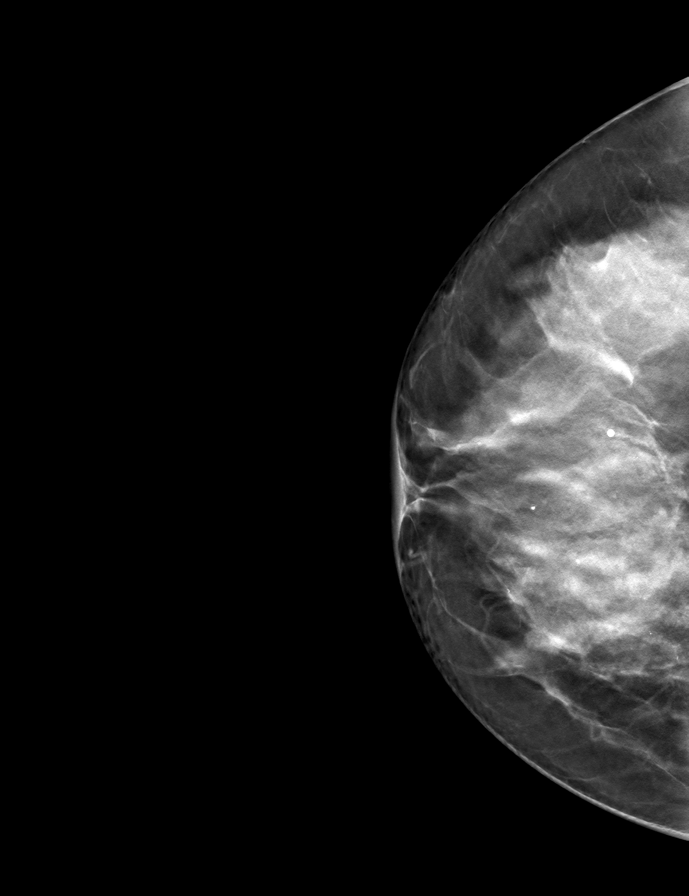

[R MLO tomo · tomo slice 46/91.0]
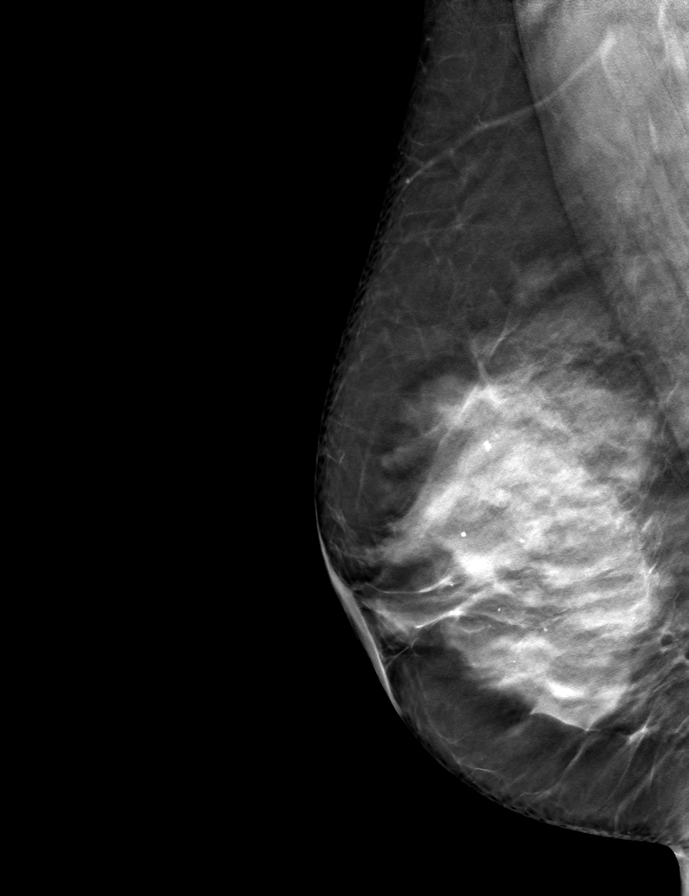

[L CC tomo · tomo slice 41/80.0]
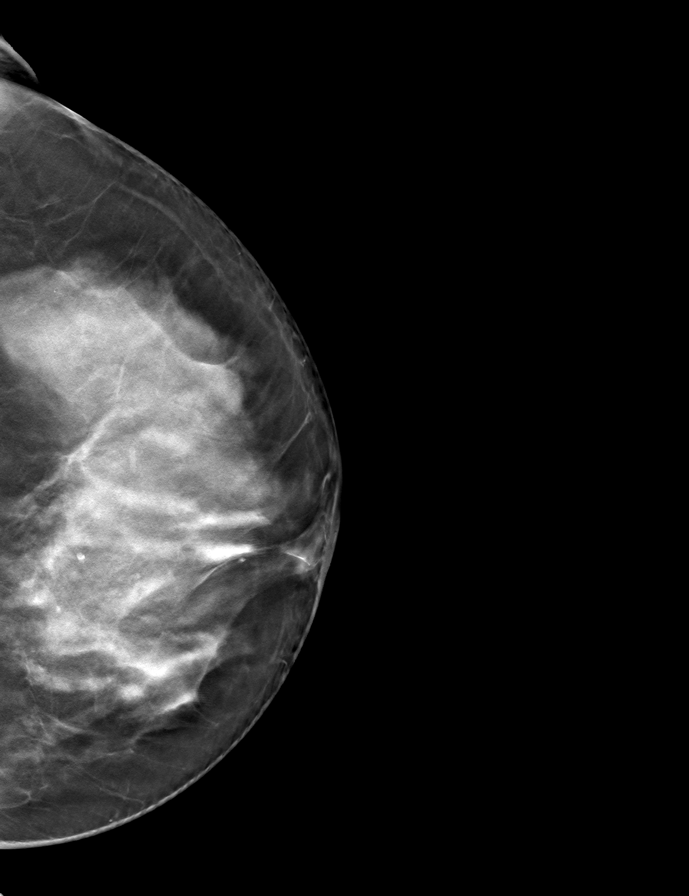

[9 of 24 positions shown; findings below may reference images not displayed]

ACR Breast Density Category d: The breast tissue is extremely dense,
which lowers the sensitivity of mammography
FINDINGS: There are no findings suspicious for malignancy. Images were
processed with CAD.
IMPRESSION: No mammographic evidence of malignancy. A result letter of this
screening mammogram will be mailed directly to the patient.

RECOMMENDATION:
Screening mammogram in one year. (Code:WO-0-ZI0)

BI-RADS CATEGORY  1: Negative.

## 2020-02-14 ENCOUNTER — Ambulatory Visit (HOSPITAL_BASED_OUTPATIENT_CLINIC_OR_DEPARTMENT_OTHER): Payer: BC Managed Care – PPO | Admitting: Genetic Counselor

## 2020-02-14 DIAGNOSIS — K635 Polyp of colon: Secondary | ICD-10-CM

## 2020-02-14 DIAGNOSIS — Z8049 Family history of malignant neoplasm of other genital organs: Secondary | ICD-10-CM | POA: Diagnosis not present

## 2020-02-14 DIAGNOSIS — Z803 Family history of malignant neoplasm of breast: Secondary | ICD-10-CM

## 2020-02-15 ENCOUNTER — Encounter: Payer: Self-pay | Admitting: Genetic Counselor

## 2020-02-15 DIAGNOSIS — Z8049 Family history of malignant neoplasm of other genital organs: Secondary | ICD-10-CM | POA: Insufficient documentation

## 2020-02-15 DIAGNOSIS — Z803 Family history of malignant neoplasm of breast: Secondary | ICD-10-CM | POA: Insufficient documentation

## 2020-02-15 NOTE — Progress Notes (Signed)
REFERRING PROVIDER: Jonathon Bellows, MD [facility] [place],  [place] [address]  PRIMARY PROVIDER:  Virginia Crews, MD  PRIMARY REASON FOR VISIT:  1. Polyp of colon, unspecified part of colon, unspecified type   2. Family history of breast cancer   3. Family history of uterine cancer      I connected with Denise Torres on 02/15/2020 at 3:00 EDT by video conference and verified that I am speaking with the correct person using two identifiers.   Patient location: work Provider location: [place] office  HISTORY OF PRESENT ILLNESS:   Ms. Kozlov, a 64 y.o. female, was seen for a [place] cancer genetics consultation at the request of Dr. Vicente Males due to a personal history of colon polyps.  Ms. Schwark presents to clinic today to discuss the possibility of a hereditary predisposition to cancer, genetic testing, and to further clarify her future cancer risks, as well as potential cancer risks for family members.   Ms. Omeara does not have a personal history of cancer. She has had three colonoscopies, which have revealed a total of 13 sessile serrated polyps.  RISK FACTORS:  Menarche was at age 78.  First live birth at age 67.  OCP use for approximately 7-9 years.  Ovaries intact: yes.  Hysterectomy: yes.  Menopausal status: postmenopausal.  HRT use: 0 years. Colonoscopy: yes; abnormal. Mammogram within the last year: yes. Number of breast biopsies: 0.   Past Medical History:  Diagnosis Date  . Arthritis    mostly in b/l hands, left knee  . Blood transfusion without reported diagnosis   . Diabetes mellitus type 2, controlled, without complications ([place]) 0/03/2724  . Diabetes mellitus without complication ([place])   . Family history of breast cancer   . Family history of uterine cancer   . Hyperlipidemia   . Lower GI bleed 09/2015    Past Surgical History:  Procedure Laterality Date  . ABDOMINAL HYSTERECTOMY    . COLONOSCOPY WITH PROPOFOL N/A 01/04/2020   Procedure:  COLONOSCOPY WITH PROPOFOL;  Surgeon: Jonathon Bellows, MD;  Location: [facility] ENDOSCOPY;  Service: Gastroenterology;  Laterality: N/A;  . DIAGNOSTIC LAPAROSCOPY    . EYE SURGERY Bilateral 02/21/1998   lasik  . LAPAROSCOPIC SUPRACERVICAL HYSTERECTOMY  2010   partial  . TONSILLECTOMY  09/21/1977  . WISDOM TOOTH EXTRACTION Bilateral 1976    Social History   Socioeconomic History  . Marital status: Married    Spouse name: Herbie Baltimore  . Number of children: 2  . Years of education: 16  . Highest education level: Bachelor's degree (e.g., BA, AB, BS)  Occupational History    Employer: [employer]  Tobacco Use  . Smoking status: Current Every Day Smoker    Packs/day: 0.50    Years: 10.00    Pack years: 5.00    Types: Cigarettes  . Smokeless tobacco: Never Used  . Tobacco comment: started smoking around 2008  Vaping Use  . Vaping Use: Never used  Substance and Sexual Activity  . Alcohol use: No    Comment: rarely  . Drug use: No  . Sexual activity: Yes  Other Topics Concern  . Not on file  Social History Narrative  . Not on file   Social Determinants of Health   Financial Resource Strain:   . Difficulty of Paying Living Expenses: Not on file  Food Insecurity:   . Worried About Charity fundraiser in the Last Year: Not on file  . Ran Out of  Food in the Last Year: Not on file  Transportation Needs:   . Lack of Transportation (Medical): Not on file  . Lack of Transportation (Non-Medical): Not on file  Physical Activity:   . Days of Exercise per Week: Not on file  . Minutes of Exercise per Session: Not on file  Stress:   . Feeling of Stress : Not on file  Social Connections:   . Frequency of Communication with Friends and Family: Not on file  . Frequency of Social Gatherings with Friends and Family: Not on file  . Attends Religious Services: Not on file  . Active Member of Clubs or Organizations: Not on file  . Attends Archivist Meetings: Not on file  . Marital  Status: Not on file     FAMILY HISTORY:  We obtained a detailed, 4-generation family history.  Significant diagnoses are listed below: Family History  Problem Relation Age of Onset  . Breast cancer Mother 20       s/p masectomy  . Diabetes Mother   . Atrial fibrillation Mother   . Diabetes Father   . Heart attack Father 61  . Stroke Father   . Rheum arthritis Brother   . Alcohol abuse Brother   . Drug abuse Brother   . Uterine cancer Paternal Grandmother 94  . Cancer Other 19       unknown type, maternal cousin's daughter   Ms. Wilbourne has two daughters (ages 59 and 15). She had four brothers. Two brothers died around the age of 77 and did not have cancer. Another brother died when he was 70 years old.   Ms. Mcneely's mother died at the age of 38 and had a history of breast cancer diagnosed when she was 8. Ms. Eberlin had 74 maternal aunts and uncles. Two aunts and two uncles are currently living, and a few died in childhood. There is no known cancer among these aunts and uncles. One of her maternal cousins had a daughter with cancer diagnosed when she was around 42 or 43, although Ms. Sublette does not know what type of cancer this was. Her maternal grandparents died in their 5s or 38s.   Ms. Bunting father is 60 and has not had cancer. She had two paternal aunts and three paternal uncles. There is no known cancer among these aunts and uncles. Her paternal grandmother died at the age of 66 and was diagnosed with uterine cancer when she was 56. Her paternal grandfather died at the age of 73.  Ms. Tetro notes that she believes her parents and her brother have had colonoscopies and have not had polyps to her knowledge.  Ms. Paddock is unaware of previous family history of genetic testing for hereditary cancer risks. Patient's maternal ancestors are of [place] and [place] descent, and paternal ancestors are of [place], [place], and [place] descent. There is no reported Ashkenazi Jewish  ancestry. There is no known consanguinity.  GENETIC COUNSELING ASSESSMENT: Ms. Khanna is a 64 y.o. female with a personal history of colon polyps which does not meet criteria for serrated polyposis syndrome at this time. We, therefore, discussed and recommended the following at today's visit.   DISCUSSION:  We discussed that polyps in general are common, however, most people have fewer than 5 lifetime polyps. When an individual has 10 or more polyps we become concerned about an underlying polyposis syndrome. Individuals with multiple sessile serrated polyps may have a condition called Serrated Polyposis Syndrome (SPS).   A clinical diagnosis  of SPS is considered in an individual who meets at least one of the following empiric criteria: 1. At least 5 serrated polyps proximal to the rectum, all being >77mm, with at least 2 being >10 mm in size 2. At least 20 serrated polyps of any size distributed throughout the large bowel, with at least 5 being proximal to the rectum.  For the majority of patients with SPS, there is no identifiable cause. Pathogenic variants in the RNF43 gene have been identified as a rare cause, as have biallelic pathogenic variants in MUTYH. The risk for colon cancer in this syndrome is elevated, although the precise risk remains to be defined. Occasionally, more than one affected case of serrated polyposis is seen in a family, and therefore family members of an individual with SPS should undergo earlier and more frequent colon screening.  Based on Ms. Solana's number, size, and location of sessile serrated polyps, she does not currently meet diagnostic criteria for SPS. We discussed that her personal history of sessile serrated polyps therefore does not meet insurance or NCCN criteria for genetic testing and is not highly consistent with a familial hereditary polyposis/cancer syndrome. We feel she is at low risk to harbor a gene mutation associated with such a condition. Thus, we did  not recommend any genetic testing, at this time, and recommended Ms. Hartsock continue to follow the cancer screening guidelines given by her primary healthcare provider.   We discussed that it is possible that she may develop more sessile serrated polyps in the future that would result in a diagnosis of SPS and a recommendation for genetic testing at that time. We discussed that if she is interested, she may still pursue genetic testing now with the understanding that insurance would not cover testing and she would need to pay the self-pay price of $250. Ms. Noy declined testing at this time.  Lastly, based on the patient's personal and family history, a statistical model Midwife) was used to estimate her risk of developing breast cancer. Tyrer-Cuzick estimates her lifetime risk of developing breast cancer to be approximately 16.2%. This estimation does not consider any genetic testing results. The patient's lifetime breast cancer risk is a preliminary estimate based on available information using one of several models endorsed by the [place] (ACS). The ACS recommends consideration of breast MRI screening as an adjunct to mammography for patients at high risk (defined as 20% or greater lifetime risk).     PLAN:  Genetic testing was not recommended and Ms. Allport did not wish to pursue genetic testing at today's visit. We remain available to coordinate genetic testing at any time in the future. We, therefore, recommend Ms. Neiswonger continue to follow the cancer screening guidelines given by her primary healthcare provider.  Ms. Haman questions were answered to her satisfaction today. Our contact information was provided should additional questions or concerns arise. Thank you for the referral and allowing [place] to share in the care of your patient.   Clint Guy, [place], [facility] Licensed, Certified Dispensing [email or web address] Phone: [phone]  The patient  was seen for a total of 35 minutes in face-to-face genetic counseling.  This patient was discussed with Drs. Magrinat, Lindi Adie and/or Burr Medico who agrees with the above.    _______________________________________________________________________ For Office Staff:  Number of people involved in session: 1 Was an Intern/ student involved with case: no

## 2020-04-30 ENCOUNTER — Ambulatory Visit: Payer: BC Managed Care – PPO | Admitting: Family Medicine

## 2020-05-05 ENCOUNTER — Ambulatory Visit: Payer: BC Managed Care – PPO | Admitting: Family Medicine

## 2020-05-12 ENCOUNTER — Encounter: Payer: Self-pay | Admitting: Family Medicine

## 2020-05-12 ENCOUNTER — Ambulatory Visit: Payer: BC Managed Care – PPO | Admitting: Family Medicine

## 2020-05-12 ENCOUNTER — Other Ambulatory Visit: Payer: Self-pay

## 2020-05-12 VITALS — BP 139/79 | HR 78 | Temp 98.2°F | Wt 146.0 lb

## 2020-05-12 DIAGNOSIS — M2550 Pain in unspecified joint: Secondary | ICD-10-CM | POA: Insufficient documentation

## 2020-05-12 DIAGNOSIS — Z23 Encounter for immunization: Secondary | ICD-10-CM

## 2020-05-12 DIAGNOSIS — M79642 Pain in left hand: Secondary | ICD-10-CM

## 2020-05-12 DIAGNOSIS — E785 Hyperlipidemia, unspecified: Secondary | ICD-10-CM | POA: Diagnosis not present

## 2020-05-12 DIAGNOSIS — M2560 Stiffness of unspecified joint, not elsewhere classified: Secondary | ICD-10-CM | POA: Diagnosis not present

## 2020-05-12 DIAGNOSIS — E1165 Type 2 diabetes mellitus with hyperglycemia: Secondary | ICD-10-CM | POA: Diagnosis not present

## 2020-05-12 DIAGNOSIS — M79641 Pain in right hand: Secondary | ICD-10-CM | POA: Diagnosis not present

## 2020-05-12 MED ORDER — METFORMIN HCL 500 MG PO TABS: 1000.0000 mg | ORAL_TABLET | Freq: Two times a day (BID) | ORAL | 3 refills | Status: DC

## 2020-05-12 MED ORDER — ATORVASTATIN CALCIUM 40 MG PO TABS: 40.0000 mg | ORAL_TABLET | Freq: Every day | ORAL | 3 refills | Status: DC

## 2020-05-12 MED ORDER — PIOGLITAZONE HCL 15 MG PO TABS
15.0000 mg | ORAL_TABLET | Freq: Every day | ORAL | 3 refills | Status: DC
Start: 2020-05-12 — End: 2020-08-11

## 2020-05-12 NOTE — Assessment & Plan Note (Signed)
Decreased ROM of left hand likely 2/2 finger nodules Refer to Rheum

## 2020-05-12 NOTE — Assessment & Plan Note (Addendum)
Previously uncontrolled Re-check A1c Issue taking farxiga due to price, put back on Actos Continue metformin Flu Vaccine today On Statin Not on ACEi Discussed diet and exercise F/u 6 months

## 2020-05-12 NOTE — Assessment & Plan Note (Addendum)
Previously well controlled  Continue atorvastatin FLP, CMP F/u 6 months

## 2020-05-12 NOTE — Assessment & Plan Note (Signed)
Sub cutaneous nodules appreciated bilaterally at the DIPJ and PIPJ joints, non-tender to palpation. Preventing ROM to close a fitst Fam Hx of rheumatoid arthritis Concerning for RA, with other joint pains Check RF, ANA, ESR, CRP Refer to Rheumatology

## 2020-05-12 NOTE — Progress Notes (Signed)
Established patient visit   Patient: Denise Torres   DOB: 1956/04/30   64 y.o. Female  MRN: [id number] Visit Date: 05/12/2020  Today's healthcare provider: [name], MD   Chief Complaint  Patient presents with  . Diabetes  . Hyperlipidemia   Subjective    HPI   [place] is doing well overall. She is experiencing no symptoms. Diet and excercise have been 'fine'. Her only concern is nodules on her hands with limited ROM. The nodules prevent her from losing her hand to make a fist, they are nontendder to touch. They have slowly progressed over the last several years. She says that it interferes with her yard work because she cannot pick up leaves or perform a "pincer grasp".   Additionally, she was unable to take her farciga because it cost her $600. She has also not been taking her glitazone.    Social History   Tobacco Use  . Smoking status: Current Every Day Smoker    Packs/day: 0.50    Years: 10.00    Pack years: 5.00    Types: Cigarettes  . Smokeless tobacco: Never Used  . Tobacco comment: started smoking around 2008  Vaping Use  . Vaping Use: Never used  Substance Use Topics  . Alcohol use: No    Comment: rarely  . Drug use: No       Medications: Outpatient Medications Prior to Visit  Medication Sig  . [DISCONTINUED] atorvastatin (LIPITOR) 40 MG tablet Take 1 tablet (40 mg total) by mouth daily.  . [DISCONTINUED] dapagliflozin propanediol (FARXIGA) 5 MG TABS tablet Take 1 tablet (5 mg total) by mouth daily before breakfast.  . [DISCONTINUED] metFORMIN (GLUCOPHAGE) 500 MG tablet Take 2 tablets (1,000 mg total) by mouth 2 (two) times daily.   No facility-administered medications prior to visit.    Review of Systems  Constitutional: Negative.   Respiratory: Negative.   Cardiovascular: Negative.   Neurological: Negative.       Objective    BP 139/79 (BP Location: Right Arm, Patient Position: Sitting, Cuff Size: Large)   Pulse 78   Temp 98.2 F  (36.8 C) (Oral)   Wt 146 lb (66.2 kg)   BMI 24.30 kg/m    Physical Exam   General: Well appearing in NAD Pulm: Lungs CTA Bilaterally Cards: RRR no murmurs rubs or gallops Extremities:  Hands: Several non-tender subcutaneous nodules disseminated around the DIPJ and PIPJ joints. ROM limited, cannot close R fist. Strength 5/5. AIN, PIN tested and intact. Tinel, Phalen negative GI: Non-distended Non-tender    No results found for any visits on 05/12/20.  Assessment & Plan     Problem List Items Addressed This Visit      Endocrine   T2DM (type 2 diabetes mellitus) ([place]) - Primary    Previously well controlled Re-check A1c Issue taking farciga due to price, put back on glitazone Continue metformin Flu Vaccine today On Statin Not on ACEi Discussed diet and exercise F/u 6 months      Relevant Medications   pioglitazone (ACTOS) 15 MG tablet   metFORMIN (GLUCOPHAGE) 500 MG tablet   atorvastatin (LIPITOR) 40 MG tablet   Other Relevant Orders   Hemoglobin A1c     Other   HLD (hyperlipidemia)    Previously well controlled  Continue atorvastatin FLP, CMP F/u 6 months      Relevant Medications   atorvastatin (LIPITOR) 40 MG tablet   Other Relevant Orders   Lipid panel   Comprehensive metabolic  panel   Polyarthralgia   Relevant Orders   ANA   Rheumatoid Factor   Sed Rate (ESR)   C-reactive protein   Ambulatory referral to Rheumatology   Decreased range of motion    Decreased ROM of left hand likely 2/2 finger nodules Refer to Rheum      Relevant Orders   ANA   Rheumatoid Factor   Sed Rate (ESR)   C-reactive protein   Ambulatory referral to Rheumatology   Bilateral hand pain    Sub cutaneous nodules appreciated bilaterally at the DIPJ and PIPJ joints, non-tender to palpation. Preventing ROM to close a fitst Fam Hx of rheumatoid arthritis Concerning for RA, with other joint pains Check RF, ANA, ESR, CRP Refer to Rheumatology      Relevant Orders   ANA     Rheumatoid Factor   Sed Rate (ESR)   C-reactive protein   Ambulatory referral to Rheumatology    Other Visit Diagnoses    Need for immunization against influenza       Relevant Orders   Flu Vaccine QUAD 36+ mos IM (Completed)       Return in about 3 months (around 08/11/2020) for chronic disease f/u.      Patient seen along with MS[employer] student [facility]. I personally evaluated this patient along with the student, and verified all aspects of the history, physical exam, and medical decision making as documented by the student. I agree with the student's documentation and have made all necessary edits.  Murel Shenberger, Dionne Bucy, MD, MPH [facility]

## 2020-05-15 DIAGNOSIS — M79641 Pain in right hand: Secondary | ICD-10-CM | POA: Diagnosis not present

## 2020-05-15 DIAGNOSIS — E785 Hyperlipidemia, unspecified: Secondary | ICD-10-CM | POA: Diagnosis not present

## 2020-05-15 DIAGNOSIS — E1165 Type 2 diabetes mellitus with hyperglycemia: Secondary | ICD-10-CM | POA: Diagnosis not present

## 2020-05-15 DIAGNOSIS — M2560 Stiffness of unspecified joint, not elsewhere classified: Secondary | ICD-10-CM | POA: Diagnosis not present

## 2020-05-15 DIAGNOSIS — M79642 Pain in left hand: Secondary | ICD-10-CM | POA: Diagnosis not present

## 2020-05-16 LAB — COMPREHENSIVE METABOLIC PANEL
ALT: 18 IU/L (ref 0–32)
AST: 16 IU/L (ref 0–40)
Albumin/Globulin Ratio: 2.6 — ABNORMAL HIGH (ref 1.2–2.2)
Albumin: 4.6 g/dL (ref 3.8–4.8)
Alkaline Phosphatase: 102 IU/L (ref 44–121)
BUN/Creatinine Ratio: 10 — ABNORMAL LOW (ref 12–28)
BUN: 9 mg/dL (ref 8–27)
Bilirubin Total: 0.4 mg/dL (ref 0.0–1.2)
CO2: 23 mmol/L (ref 20–29)
Calcium: 9.6 mg/dL (ref 8.7–10.3)
Chloride: 101 mmol/L (ref 96–106)
Creatinine, Ser: 0.86 mg/dL (ref 0.57–1.00)
GFR calc Af Amer: 84 mL/min/{1.73_m2} (ref >59)
GFR calc non Af Amer: 73 mL/min/{1.73_m2} (ref >59)
Globulin, Total: 1.8 g/dL (ref 1.5–4.5)
Glucose: 174 mg/dL — ABNORMAL HIGH (ref 65–99)
Potassium: 4.4 mmol/L (ref 3.5–5.2)
Sodium: 140 mmol/L (ref 134–144)
Total Protein: 6.4 g/dL (ref 6.0–8.5)

## 2020-05-16 LAB — ANA: ANA Titer 1: NEGATIVE

## 2020-05-16 LAB — LIPID PANEL
Chol/HDL Ratio: 3.6 ratio (ref 0.0–4.4)
Cholesterol, Total: 174 mg/dL (ref 100–199)
HDL: 48 mg/dL (ref >39)
LDL Chol Calc (NIH): 103 mg/dL — ABNORMAL HIGH (ref 0–99)
Triglycerides: 131 mg/dL (ref 0–149)
VLDL Cholesterol Cal: 23 mg/dL (ref 5–40)

## 2020-05-16 LAB — HEMOGLOBIN A1C
Est. average glucose Bld gHb Est-mCnc: 194 mg/dL
Hgb A1c MFr Bld: 8.4 % — ABNORMAL HIGH (ref 4.8–5.6)

## 2020-05-16 LAB — SEDIMENTATION RATE: Sed Rate: 2 mm/hr (ref 0–40)

## 2020-05-16 LAB — C-REACTIVE PROTEIN: CRP: 1 mg/L (ref 0–10)

## 2020-05-16 LAB — RHEUMATOID FACTOR: Rheumatoid fact SerPl-aCnc: <10 IU/mL (ref <14.0)

## 2020-07-16 DIAGNOSIS — M154 Erosive (osteo)arthritis: Secondary | ICD-10-CM | POA: Diagnosis not present

## 2020-07-16 DIAGNOSIS — M19042 Primary osteoarthritis, left hand: Secondary | ICD-10-CM | POA: Diagnosis not present

## 2020-07-22 ENCOUNTER — Other Ambulatory Visit: Payer: Self-pay

## 2020-07-22 ENCOUNTER — Encounter: Payer: Self-pay | Admitting: Occupational Therapy

## 2020-07-22 ENCOUNTER — Ambulatory Visit: Payer: BC Managed Care – PPO | Attending: Rheumatology | Admitting: Occupational Therapy

## 2020-07-22 DIAGNOSIS — M25642 Stiffness of left hand, not elsewhere classified: Secondary | ICD-10-CM | POA: Diagnosis not present

## 2020-07-22 DIAGNOSIS — M25542 Pain in joints of left hand: Secondary | ICD-10-CM

## 2020-07-22 DIAGNOSIS — M79641 Pain in right hand: Secondary | ICD-10-CM | POA: Diagnosis not present

## 2020-07-22 DIAGNOSIS — M6281 Muscle weakness (generalized): Secondary | ICD-10-CM

## 2020-07-22 NOTE — Therapy (Signed)
[place] [facility] [address], [place], [address] Phone: [phone]   Fax:  [phone]  Occupational Therapy Evaluation  Patient Details  Name: Denise Torres MRN: [id number] Date of Birth: 08-Jan-1956 Referring Provider (OT): Dr Posey Pronto   Encounter Date: 07/22/2020   OT End of Session - 07/22/20 1658    Visit Number 1    Number of Visits 4    Date for OT Re-Evaluation 08/26/20    OT Start Time 1015    OT Stop Time 1106    OT Time Calculation (min) 51 min    Activity Tolerance Patient tolerated treatment well    Behavior During Therapy [facility] for tasks assessed/performed           Past Medical History:  Diagnosis Date   Arthritis    mostly in b/l hands, left knee   Blood transfusion without reported diagnosis    Diabetes mellitus type 2, controlled, without complications ([place]) 1/95/0932   Diabetes mellitus without complication ([place])    Family history of breast cancer    Family history of uterine cancer    Hyperlipidemia    Lower GI bleed 09/2015    Past Surgical History:  Procedure Laterality Date   ABDOMINAL HYSTERECTOMY     COLONOSCOPY WITH PROPOFOL N/A 01/04/2020   Procedure: COLONOSCOPY WITH PROPOFOL;  Surgeon: Jonathon Bellows, MD;  Location: [facility] ENDOSCOPY;  Service: Gastroenterology;  Laterality: N/A;   DIAGNOSTIC LAPAROSCOPY     EYE SURGERY Bilateral 02/21/1998   lasik   LAPAROSCOPIC SUPRACERVICAL HYSTERECTOMY  2010   partial   TONSILLECTOMY  09/21/1977   WISDOM TOOTH EXTRACTION Bilateral 1976    There were no vitals filed for this visit.   Subjective Assessment - 07/22/20 1630    Subjective  Both my hands but the L worse than R started hurting about 3 months ago - increase stiffness, weakness in hands - and pain with bending my fingers - can increase to about 4/10 - ache and stiff - my hands are stiff in the morning - computer do not bother me - it is when I need to grip or push or  pull or put pressure thru my fingers    Pertinent History Pt seen Dr Posey Pronto - Erosive osteoarthritis of hand  - hydrOXYchloroQUINE (PLAQUENIL) 200 mg tablet; Take 1 tablet (200 mg total) by mouth once daily  - X-ray hand right minimum 3 views; Future  - X-ray hand left minimum 3 views; Future    -- She has erosive osteoarthritis of the hands. She does not have inflammatory arthritis.  -- Trial of Plaquenil 200 mg daily  -- Check hand xray  -- Refer to OT, she will let us know where to refer her in [place] she try paraffin wax    Return in about 4 months (around 11/13/2020) for Routine Follow Up    Patient Stated Goals I want to know things I can do at home to decrease pain , increase my motion and strength so I can use my hands better and with less pain    Currently in Pain? Yes    Pain Score 4     Pain Location Hand    Pain Orientation Right;Left    Pain Descriptors / Indicators Aching;Tightness    Pain Type Chronic pain    Pain Onset More than a month ago    Pain Frequency Intermittent    Aggravating Factors  gripping ,pulling , pushing ,  pinching             [facility] OT Assessment - 07/22/20 0001      Assessment   Medical Diagnosis OA in bilateral hands    Referring Provider (OT) Dr Posey Pronto    Onset Date/Surgical Date --   3 months ago   Hand Dominance Right    Next MD Visit --   4 months with Dr Posey Pronto     Home  Environment   Lives With Spouse      Prior Function   Vocation Full time employment    Leisure Accountant on computer, work in yard , house work      Strength   Right Hand Grip (lbs) 49    Right Hand Lateral Pinch 17 lbs    Right Hand 3 Point Pinch 14 lbs    Left Hand Grip (lbs) 35    Left Hand Lateral Pinch 14 lbs    Left Hand 3 Point Pinch 12 lbs      Right Hand AROM   R Index  MCP 0-90 80 Degrees    R Index PIP 0-100 85 Degrees   -20   R Long  MCP 0-90 90 Degrees    R Long PIP 0-100 100 Degrees    R Ring  MCP 0-90 85 Degrees    R Ring PIP 0-100  100 Degrees    R Little  MCP 0-90 95 Degrees    R Little PIP 0-100 70 Degrees      Left Hand AROM   L Index  MCP 0-90 85 Degrees    L Index PIP 0-100 70 Degrees   -20   L Long  MCP 0-90 90 Degrees    L Long PIP 0-100 60 Degrees   -20   L Ring  MCP 0-90 90 Degrees    L Ring PIP 0-100 100 Degrees    L Little  MCP 0-90 90 Degrees    L Little PIP 0-100 90 Degrees                    OT Treatments/Exercises (OP) - 07/22/20 0001      RUE Paraffin   Number Minutes Paraffin 8 Minutes    RUE Paraffin Location Hand    Comments decreaes stiffness and pain      LUE Paraffin   Number Minutes Paraffin 8 Minutes    LUE Paraffin Location Hand    Comments decrease stiffness and pain           Moist heat in am and pm Tendon glides AROM - light and block wrist 10 reps Opposition to all digits  10 reps Joint protection principles - hand out review and AE education         OT Education - 07/22/20 1657    Education Details findings of eval and HEP    Person(s) Educated Patient    Methods Explanation;Demonstration;Tactile cues;Verbal cues;Handout    Comprehension Verbal cues required;Returned demonstration;Verbalized understanding               OT Long Term Goals - 07/22/20 1702      OT LONG TERM GOAL #1   Title Pt to be independent in HEP to increase AROM in L 2nd and 3rd digit , decrease pain with grip and 3 point pinch    Baseline decrease AROM in L 2nd and 3rd , more than R hand - no knowledge of HEP - pain 4/10 with grip , pinch or  use    Time 3    Period Weeks    Status New    Target Date 08/12/20      OT LONG TERM GOAL #2   Title Pt to verbalize use of 3 joint protectoin or AE to decrease pain in hands and increase ease of use of hands in ADL's and IADL's    Baseline no knowledge    Time 5    Period Weeks    Status New    Target Date 08/26/20                 Plan - 07/22/20 1658    Clinical Impression Statement Pt refer with diagnosis of OA  with bilateral hands pain -L worse than R - increase stiffness ,decrease ROM and strength - pt show decrease AROM with increase pain in L 2nd and 3rd digits , L 2nd and 5th - with grip and 3 point grip on lower range for her age and pain - lmiting her functional use of hands in ADL's and IADL's - pt can benefit from OT services    OT Occupational Profile and History Problem Focused Assessment - Including review of records relating to presenting problem    Occupational performance deficits (Please refer to evaluation for details): ADL's;IADL's;Play;Leisure;Social Participation    Body Structure / Function / Physical Skills ADL;Coordination;Dexterity;Flexibility;ROM;UE functional use;Pain;Strength;IADL    Rehab Potential Fair    Clinical Decision Making Limited treatment options, no task modification necessary    Comorbidities Affecting Occupational Performance: May have comorbidities impacting occupational performance   OA chronic condition   Modification or Assistance to Complete Evaluation  No modification of tasks or assist necessary to complete eval    OT Frequency 1x / week   1 x wk for 1 wks , biweekly 4 wks   OT Duration --   5 wks   OT Treatment/Interventions Self-care/ADL training;Paraffin;Therapeutic exercise;DME and/or AE instruction;Manual Therapy;Splinting;Patient/family education    Consulted and Agree with Plan of Care Patient           Patient will benefit from skilled therapeutic intervention in order to improve the following deficits and impairments:   Body Structure / Function / Physical Skills: ADL,Coordination,Dexterity,Flexibility,ROM,UE functional use,Pain,Strength,IADL       Visit Diagnosis: Pain in joint of left hand - Plan: Ot plan of care cert/re-cert  Pain in right hand - Plan: Ot plan of care cert/re-cert  Stiffness of left hand, not elsewhere classified - Plan: Ot plan of care cert/re-cert  Muscle weakness (generalized) - Plan: Ot plan of care  cert/re-cert    Problem List Patient Active Problem List   Diagnosis Date Noted   Polyarthralgia 05/12/2020   Decreased range of motion 05/12/2020   Bilateral hand pain 05/12/2020   Family history of breast cancer    Family history of uterine cancer    Annual physical exam 11/02/2019   H/O: GI bleed 03/23/2017   HLD (hyperlipidemia) 03/23/2017   Tobacco abuse 03/23/2017   Colon polyps 03/23/2017   T2DM (type 2 diabetes mellitus) ([place]) 10/11/2015    Rosalyn Gess OTR/L,CLT 07/22/2020, 5:07 PM  [place] [facility] [address] [place], [address] Phone: [phone]   Fax:  [phone]  Name: Denise Torres MRN: [id number] Date of Birth: [birth year]

## 2020-07-22 NOTE — Patient Instructions (Signed)
Moist heat in am and pm Tendon glides AROM - light and block wrist 10 reps Opposition to all digits  10 reps Joint protection principles - hand out review and AE education

## 2020-07-29 ENCOUNTER — Other Ambulatory Visit: Payer: Self-pay

## 2020-07-29 ENCOUNTER — Ambulatory Visit: Payer: BC Managed Care – PPO | Admitting: Occupational Therapy

## 2020-07-29 DIAGNOSIS — M25542 Pain in joints of left hand: Secondary | ICD-10-CM | POA: Diagnosis not present

## 2020-07-29 DIAGNOSIS — M6281 Muscle weakness (generalized): Secondary | ICD-10-CM

## 2020-07-29 DIAGNOSIS — M25642 Stiffness of left hand, not elsewhere classified: Secondary | ICD-10-CM

## 2020-07-29 DIAGNOSIS — M79641 Pain in right hand: Secondary | ICD-10-CM

## 2020-07-29 NOTE — Therapy (Signed)
[facility] [facility] [address], [place], [address] Phone: [phone]   Fax:  [phone]  Occupational Therapy Treatment  Patient Details  Name: Denise Torres MRN: [id number] Date of Birth: 1956-01-16 Referring Provider (OT): Dr Posey Pronto   Encounter Date: 07/29/2020   OT End of Session - 07/29/20 1023    Visit Number 2    Number of Visits 4    Date for OT Re-Evaluation 08/26/20    OT Start Time 1017    OT Stop Time 1053    OT Time Calculation (min) 36 min    Activity Tolerance Patient tolerated treatment well    Behavior During Therapy [facility] for tasks assessed/performed           Past Medical History:  Diagnosis Date  . Arthritis    mostly in b/l hands, left knee  . Blood transfusion without reported diagnosis   . Diabetes mellitus type 2, controlled, without complications ([place]) 02/14/4096  . Diabetes mellitus without complication ([place])   . Family history of breast cancer   . Family history of uterine cancer   . Hyperlipidemia   . Lower GI bleed 09/2015    Past Surgical History:  Procedure Laterality Date  . ABDOMINAL HYSTERECTOMY    . COLONOSCOPY WITH PROPOFOL N/A 01/04/2020   Procedure: COLONOSCOPY WITH PROPOFOL;  Surgeon: Jonathon Bellows, MD;  Location: [facility] ENDOSCOPY;  Service: Gastroenterology;  Laterality: N/A;  . DIAGNOSTIC LAPAROSCOPY    . EYE SURGERY Bilateral 02/21/1998   lasik  . LAPAROSCOPIC SUPRACERVICAL HYSTERECTOMY  2010   partial  . TONSILLECTOMY  09/21/1977  . WISDOM TOOTH EXTRACTION Bilateral 1976    There were no vitals filed for this visit.   Subjective Assessment - 07/29/20 1019    Subjective  feel like little more motion- parafin bath did get one -but trying to get timing correct for right heat - pain wiht things I do wrong- trying to change my ways    Pertinent History Pt seen Dr Posey Pronto - Erosive osteoarthritis of hand  - hydrOXYchloroQUINE (PLAQUENIL) 200 mg tablet; Take 1 tablet  (200 mg total) by mouth once daily  - X-ray hand right minimum 3 views; Future  - X-ray hand left minimum 3 views; Future    -- She has erosive osteoarthritis of the hands. She does not have inflammatory arthritis.  -- Trial of Plaquenil 200 mg daily  -- Check hand xray  -- Refer to OT, she will let us know where to refer her in [place] she try paraffin wax    Return in about 4 months (around 11/13/2020) for Routine Follow Up    Patient Stated Goals I want to know things I can do at home to decrease pain , increase my motion and strength so I can use my hands better and with less pain    Currently in Pain? Yes    Pain Score 0-No pain              [facility] OT Assessment - 07/29/20 0001      Strength   Right Hand Grip (lbs) 49    Right Hand Lateral Pinch 17 lbs    Right Hand 3 Point Pinch 14 lbs    Left Hand Grip (lbs) 40    Left Hand Lateral Pinch 14 lbs    Left Hand 3 Point Pinch 12 lbs      Right Hand AROM   R Index  MCP 0-90  85 Degrees    R Index PIP 0-100 90 Degrees   0   R Long  MCP 0-90 90 Degrees    R Long PIP 0-100 100 Degrees    R Ring  MCP 0-90 90 Degrees    R Ring PIP 0-100 100 Degrees    R Little  MCP 0-90 95 Degrees    R Little PIP 0-100 75 Degrees      Left Hand AROM   L Index  MCP 0-90 90 Degrees    L Index PIP 0-100 75 Degrees   -15   L Long  MCP 0-90 90 Degrees    L Long PIP 0-100 65 Degrees   -15   L Ring  MCP 0-90 90 Degrees    L Ring PIP 0-100 100 Degrees    L Little  MCP 0-90 95 Degrees    L Little PIP 0-100 90 Degrees           Pt showed increase AROM in some of her digits -see flowsheet And increase grip strength on L - less pain  And trying to modify and use joint protection principles         OT Treatments/Exercises (OP) - 07/29/20 0001      RUE Paraffin   Number Minutes Paraffin 8 Minutes    RUE Paraffin Location Hand    Comments decrease stiffness      LUE Paraffin   Number Minutes Paraffin 8 Minutes    LUE Paraffin  Location Hand    Comments decrease stiffness               Pt did get paraffin bath she uses in am and pm Tendon glides AROM - light and block wrist - needed review this date - pt was forcing and causing some soreness 10 reps Opposition to all digits  10 reps Joint protection principles - hand out review and AE education  Review again with pt -she implementing some of it Still trying to figure out for cutting fabric - holding her ruler  Doing well       OT Education - 07/29/20 1022    Education Details progress and HEP review    Person(s) Educated Patient    Methods Explanation;Demonstration;Tactile cues;Verbal cues;Handout    Comprehension Verbal cues required;Returned demonstration;Verbalized understanding               OT Long Term Goals - 07/22/20 1702      OT LONG TERM GOAL #1   Title Pt to be independent in HEP to increase AROM in L 2nd and 3rd digit , decrease pain with grip and 3 point pinch    Baseline decrease AROM in L 2nd and 3rd , more than R hand - no knowledge of HEP - pain 4/10 with grip , pinch or use    Time 3    Period Weeks    Status New    Target Date 08/12/20      OT LONG TERM GOAL #2   Title Pt to verbalize use of 3 joint protectoin or AE to decrease pain in hands and increase ease of use of hands in ADL's and IADL's    Baseline no knowledge    Time 5    Period Weeks    Status New    Target Date 08/26/20                 Plan - 07/29/20 1023    Clinical Impression Statement Pt  refer with OA and pain in  bilateral hands- L hand was worse than R - pt show  this date increase AROM in some of digits and increase grip strength L hand - doing well with HEP -and review again not to force - gentle AROM - pt to follow up in 3 wks    OT Occupational Profile and History Problem Focused Assessment - Including review of records relating to presenting problem    Occupational performance deficits (Please refer to evaluation for details):  ADL's;IADL's;Play;Leisure;Social Participation    Body Structure / Function / Physical Skills ADL;Coordination;Dexterity;Flexibility;ROM;UE functional use;Pain;Strength;IADL    Rehab Potential Fair    Clinical Decision Making Limited treatment options, no task modification necessary    Comorbidities Affecting Occupational Performance: May have comorbidities impacting occupational performance    Modification or Assistance to Complete Evaluation  No modification of tasks or assist necessary to complete eval    OT Frequency --   in 3 wks   OT Duration 4 weeks    OT Treatment/Interventions Self-care/ADL training;Paraffin;Therapeutic exercise;DME and/or AE instruction;Manual Therapy;Splinting;Patient/family education    Consulted and Agree with Plan of Care Patient           Patient will benefit from skilled therapeutic intervention in order to improve the following deficits and impairments:   Body Structure / Function / Physical Skills: ADL,Coordination,Dexterity,Flexibility,ROM,UE functional use,Pain,Strength,IADL       Visit Diagnosis: Pain in right hand  Stiffness of left hand, not elsewhere classified  Muscle weakness (generalized)  Pain in joint of left hand    Problem List Patient Active Problem List   Diagnosis Date Noted  . Polyarthralgia 05/12/2020  . Decreased range of motion 05/12/2020  . Bilateral hand pain 05/12/2020  . Family history of breast cancer   . Family history of uterine cancer   . Annual physical exam 11/02/2019  . H/O: GI bleed 03/23/2017  . HLD (hyperlipidemia) 03/23/2017  . Tobacco abuse 03/23/2017  . Colon polyps 03/23/2017  . T2DM (type 2 diabetes mellitus) ([place]) 10/11/2015    Rosalyn Gess OTR/L,CLT 07/29/2020, 10:55 AM  [facility] [address] [place], [address] Phone: [phone]   Fax:  [phone]  Name: Denise Torres MRN: [id number] Date of Birth:  1955-12-11

## 2020-08-11 ENCOUNTER — Ambulatory Visit: Payer: BC Managed Care – PPO | Admitting: Family Medicine

## 2020-08-11 ENCOUNTER — Encounter: Payer: Self-pay | Admitting: Family Medicine

## 2020-08-11 ENCOUNTER — Other Ambulatory Visit: Payer: Self-pay

## 2020-08-11 VITALS — BP 102/56 | HR 74 | Temp 97.0°F | Resp 16 | Ht 65.0 in | Wt 144.0 lb

## 2020-08-11 DIAGNOSIS — Z720 Tobacco use: Secondary | ICD-10-CM

## 2020-08-11 DIAGNOSIS — E1165 Type 2 diabetes mellitus with hyperglycemia: Secondary | ICD-10-CM

## 2020-08-11 LAB — POCT GLYCOSYLATED HEMOGLOBIN (HGB A1C)
Est. average glucose Bld gHb Est-mCnc: 180
Hemoglobin A1C: 7.9 % — AB (ref 4.0–5.6)

## 2020-08-11 LAB — POCT UA - MICROALBUMIN: Microalbumin Ur, POC: 20 mg/L

## 2020-08-11 MED ORDER — PIOGLITAZONE HCL 30 MG PO TABS: 30.0000 mg | ORAL_TABLET | Freq: Every day | ORAL | 3 refills | Status: DC

## 2020-08-11 NOTE — Assessment & Plan Note (Signed)
Patient remains precontemplative about quitting

## 2020-08-11 NOTE — Progress Notes (Signed)
Established patient visit   Patient: Denise Torres   DOB: 05-15-1956   65 y.o. Female  MRN: [id number] Visit Date: 08/11/2020  Today's healthcare provider: [name], MD   Chief Complaint  Patient presents with  . Diabetes   Subjective    HPI   Diabetes Mellitus Type II, Follow-up  Lab Results  Component Value Date   HGBA1C 7.9 (A) 08/11/2020   HGBA1C 8.4 (H) 05/15/2020   HGBA1C 7.2 (H) 11/02/2019   Wt Readings from Last 3 Encounters:  08/11/20 144 lb (65.3 kg)  05/12/20 146 lb (66.2 kg)  01/04/20 140 lb 4.5 oz (63.6 kg)   Last seen for diabetes 3 months ago.  Management since then includes none. She reports excellent compliance with treatment. She is not having side effects.  Symptoms: No fatigue No foot ulcerations  No appetite changes No nausea  No paresthesia of the feet  No polydipsia  No polyuria No visual disturbances   No vomiting     Home blood sugar records: fasting range: not being checked  Episodes of hypoglycemia? No    Current insulin regiment: none Most Recent Eye Exam: UTD Current exercise: none Current diet habits: in general, a "healthy" diet    Pertinent Labs: Lab Results  Component Value Date   CHOL 174 05/15/2020   HDL 48 05/15/2020   LDLCALC 103 (H) 05/15/2020   TRIG 131 05/15/2020   CHOLHDL 3.6 05/15/2020   Lab Results  Component Value Date   NA 140 05/15/2020   K 4.4 05/15/2020   CREATININE 0.86 05/15/2020   GFRNONAA 73 05/15/2020   GFRAA 84 05/15/2020   GLUCOSE 174 (H) 05/15/2020     ---------------------------------------------------------------------------------------------------   Medications: Outpatient Medications Prior to Visit  Medication Sig  . atorvastatin (LIPITOR) 40 MG tablet Take 1 tablet (40 mg total) by mouth daily.  . hydroxychloroquine (PLAQUENIL) 200 MG tablet Take 200 mg by mouth daily.  . metFORMIN (GLUCOPHAGE) 500 MG tablet Take 2 tablets (1,000 mg total) by mouth 2 (two) times  daily.  . [DISCONTINUED] pioglitazone (ACTOS) 15 MG tablet Take 1 tablet (15 mg total) by mouth daily.   No facility-administered medications prior to visit.    Review of Systems  Constitutional: Negative.   Eyes: Negative.   Respiratory: Negative.   Cardiovascular: Negative.   Gastrointestinal: Negative.   Endocrine: Negative.     Last hemoglobin A1c Lab Results  Component Value Date   HGBA1C 7.9 (A) 08/11/2020       Objective    BP (!) 102/56 (BP Location: Left Arm, Patient Position: Sitting, Cuff Size: Normal)   Pulse 74   Temp (!) 97 F (36.1 C)   Resp 16   Ht 5\' 5"  (1.651 m)   Wt 144 lb (65.3 kg)   SpO2 97%   BMI 23.96 kg/m  BP Readings from Last 3 Encounters:  08/11/20 (!) 102/56  05/12/20 139/79  01/04/20 121/81   Wt Readings from Last 3 Encounters:  08/11/20 144 lb (65.3 kg)  05/12/20 146 lb (66.2 kg)  01/04/20 140 lb 4.5 oz (63.6 kg)       Physical Exam Vitals reviewed.  Constitutional:      General: She is not in acute distress.    Appearance: Normal appearance. She is well-developed. She is not diaphoretic.  HENT:     Head: Normocephalic and atraumatic.  Eyes:     General: No scleral icterus.    Conjunctiva/sclera: Conjunctivae normal.  Neck:  Thyroid: No thyromegaly.  Cardiovascular:     Rate and Rhythm: Normal rate and regular rhythm.     Pulses: Normal pulses.     Heart sounds: Normal heart sounds. No murmur heard.   Pulmonary:     Effort: Pulmonary effort is normal. No respiratory distress.     Breath sounds: Normal breath sounds. No wheezing, rhonchi or rales.  Musculoskeletal:     Cervical back: Neck supple.     Right lower leg: No edema.     Left lower leg: No edema.  Lymphadenopathy:     Cervical: No cervical adenopathy.  Skin:    General: Skin is warm and dry.     Findings: No rash.  Neurological:     Mental Status: She is alert and oriented to person, place, and time. Mental status is at baseline.  Psychiatric:         Mood and Affect: Mood normal.        Behavior: Behavior normal.       Results for orders placed or performed in visit on 08/11/20  POCT glycosylated hemoglobin (Hb A1C)  Result Value Ref Range   Hemoglobin A1C 7.9 (A) 4.0 - 5.6 %   Est. average glucose Bld gHb Est-mCnc 180   POCT UA - Microalbumin  Result Value Ref Range   Microalbumin Ur, POC 20 mg/L    Assessment & Plan     Problem List Items Addressed This Visit      Endocrine   T2DM (type 2 diabetes mellitus) ([place]) - Primary    Uncontrolled, but A1c is slowly improving Discussed diet and exercise Foot exam today Up-to-date on eye exam Urine microalbumin today Up-to-date on vaccinations Continue metformin at current dose Increase Actos to 30 mg daily Plan to switch back to SGLT2 after she switches to Medicare and has better drug coverage on her insurance Follow-up in 3 months and repeat A1c      Relevant Medications   pioglitazone (ACTOS) 30 MG tablet   Other Relevant Orders   POCT glycosylated hemoglobin (Hb A1C) (Completed)   POCT UA - Microalbumin (Completed)     Other   Tobacco abuse    Patient remains precontemplative about quitting          Return in about 3 months (around 11/08/2020) for CPE.      I, [name], MD, have reviewed all documentation for this visit. The documentation on 08/11/20 for the exam, diagnosis, procedures, and orders are all accurate and complete.   Jaleen Finch, Dionne Bucy, MD, MPH [facility]

## 2020-08-11 NOTE — Patient Instructions (Signed)
Diabetes Mellitus and Nutrition, Adult When you have diabetes, or diabetes mellitus, it is very important to have healthy eating habits because your blood sugar (glucose) levels are greatly affected by what you eat and drink. Eating healthy foods in the right amounts, at about the same times every day, can help you:  Control your blood glucose.  Lower your risk of heart disease.  Improve your blood pressure.  Reach or maintain a healthy weight. What can affect my meal plan? Every person with diabetes is different, and each person has different needs for a meal plan. Your health care provider may recommend that you work with a dietitian to make a meal plan that is best for you. Your meal plan may vary depending on factors such as:  The calories you need.  The medicines you take.  Your weight.  Your blood glucose, blood pressure, and cholesterol levels.  Your activity level.  Other health conditions you have, such as heart or kidney disease. How do carbohydrates affect me? Carbohydrates, also called carbs, affect your blood glucose level more than any other type of food. Eating carbs naturally raises the amount of glucose in your blood. Carb counting is a method for keeping track of how many carbs you eat. Counting carbs is important to keep your blood glucose at a healthy level, especially if you use insulin or take certain oral diabetes medicines. It is important to know how many carbs you can safely have in each meal. This is different for every person. Your dietitian can help you calculate how many carbs you should have at each meal and for each snack. How does alcohol affect me? Alcohol can cause a sudden decrease in blood glucose (hypoglycemia), especially if you use insulin or take certain oral diabetes medicines. Hypoglycemia can be a life-threatening condition. Symptoms of hypoglycemia, such as sleepiness, dizziness, and confusion, are similar to symptoms of having too much  alcohol.  Do not drink alcohol if: ? Your health care provider tells you not to drink. ? You are pregnant, may be pregnant, or are planning to become pregnant.  If you drink alcohol: ? Do not drink on an empty stomach. ? Limit how much you use to:  0-1 drink a day for women.  0-2 drinks a day for men. ? Be aware of how much alcohol is in your drink. In the [place] one drink equals one 12 oz bottle of beer (355 mL), one 5 oz glass of wine (148 mL), or one 1 oz glass of hard liquor (44 mL). ? Keep yourself hydrated with water, diet soda, or unsweetened iced tea.  Keep in mind that regular soda, juice, and other mixers may contain a lot of sugar and must be counted as carbs. What are tips for following this plan? Reading food labels  Start by checking the serving size on the "Nutrition Facts" label of packaged foods and drinks. The amount of calories, carbs, fats, and other nutrients listed on the label is based on one serving of the item. Many items contain more than one serving per package.  Check the total grams (g) of carbs in one serving. You can calculate the number of servings of carbs in one serving by dividing the total carbs by 15. For example, if a food has 30 g of total carbs per serving, it would be equal to 2 servings of carbs.  Check the number of grams (g) of saturated fats and trans fats in one serving. Choose foods that have   a low amount or none of these fats.  Check the number of milligrams (mg) of salt (sodium) in one serving. Most people should limit total sodium intake to less than 2,300 mg per day.  Always check the nutrition information of foods labeled as "low-fat" or "nonfat." These foods may be higher in added sugar or refined carbs and should be avoided.  Talk to your dietitian to identify your daily goals for nutrients listed on the label. Shopping  Avoid buying canned, pre-made, or processed foods. These foods tend to be high in fat, sodium, and added  sugar.  Shop around the outside edge of the grocery store. This is where you will most often find fresh fruits and vegetables, bulk grains, fresh meats, and fresh dairy. Cooking  Use low-heat cooking methods, such as baking, instead of high-heat cooking methods like deep frying.  Cook using healthy oils, such as olive, canola, or sunflower oil.  Avoid cooking with butter, cream, or high-fat meats. Meal planning  Eat meals and snacks regularly, preferably at the same times every day. Avoid going long periods of time without eating.  Eat foods that are high in fiber, such as fresh fruits, vegetables, beans, and whole grains. Talk with your dietitian about how many servings of carbs you can eat at each meal.  Eat 4-6 oz (112-168 g) of lean protein each day, such as lean meat, chicken, fish, eggs, or tofu. One ounce (oz) of lean protein is equal to: ? 1 oz (28 g) of meat, chicken, or fish. ? 1 egg. ?  cup (62 g) of tofu.  Eat some foods each day that contain healthy fats, such as avocado, nuts, seeds, and fish.   What foods should I eat? Fruits Berries. Apples. Oranges. Peaches. Apricots. Plums. Grapes. Mango. Papaya. Pomegranate. Kiwi. Cherries. Vegetables Lettuce. Spinach. Leafy greens, including kale, chard, collard greens, and mustard greens. Beets. Cauliflower. Cabbage. Broccoli. Carrots. Green beans. Tomatoes. Peppers. Onions. Cucumbers. Brussels sprouts. Grains Whole grains, such as whole-wheat or whole-grain bread, crackers, tortillas, cereal, and pasta. Unsweetened oatmeal. Quinoa. Brown or wild rice. Meats and other proteins Seafood. Poultry without skin. Lean cuts of poultry and beef. Tofu. Nuts. Seeds. Dairy Low-fat or fat-free dairy products such as milk, yogurt, and cheese. The items listed above may not be a complete list of foods and beverages you can eat. Contact a dietitian for more information. What foods should I avoid? Fruits Fruits canned with  syrup. Vegetables Canned vegetables. Frozen vegetables with butter or cream sauce. Grains Refined white flour and flour products such as bread, pasta, snack foods, and cereals. Avoid all processed foods. Meats and other proteins Fatty cuts of meat. Poultry with skin. Breaded or fried meats. Processed meat. Avoid saturated fats. Dairy Full-fat yogurt, cheese, or milk. Beverages Sweetened drinks, such as soda or iced tea. The items listed above may not be a complete list of foods and beverages you should avoid. Contact a dietitian for more information. Questions to ask a health care provider  Do I need to meet with a diabetes educator?  Do I need to meet with a dietitian?  What number can I call if I have questions?  When are the best times to check my blood glucose? Where to find more information:  American Diabetes Association: diabetes.org  Academy of Nutrition and Dietetics: www.eatright.org  National Institute of Diabetes and Digestive and Kidney Diseases: www.niddk.nih.gov  Association of Diabetes Care and Education Specialists: www.diabeteseducator.org Summary  It is important to have healthy eating   habits because your blood sugar (glucose) levels are greatly affected by what you eat and drink.  A healthy meal plan will help you control your blood glucose and maintain a healthy lifestyle.  Your health care provider may recommend that you work with a dietitian to make a meal plan that is best for you.  Keep in mind that carbohydrates (carbs) and alcohol have immediate effects on your blood glucose levels. It is important to count carbs and to use alcohol carefully. This information is not intended to replace advice given to you by your health care provider. Make sure you discuss any questions you have with your health care provider. Document Revised: 05/08/2019 Document Reviewed: 05/08/2019 Elsevier Patient Education  2021 Elsevier Inc.  

## 2020-08-11 NOTE — Assessment & Plan Note (Signed)
Uncontrolled, but A1c is slowly improving Discussed diet and exercise Foot exam today Up-to-date on eye exam Urine microalbumin today Up-to-date on vaccinations Continue metformin at current dose Increase Actos to 30 mg daily Plan to switch back to SGLT2 after she switches to Medicare and has better drug coverage on her insurance Follow-up in 3 months and repeat A1c

## 2020-08-19 ENCOUNTER — Other Ambulatory Visit: Payer: Self-pay

## 2020-08-19 ENCOUNTER — Ambulatory Visit: Payer: BC Managed Care – PPO | Attending: Rheumatology | Admitting: Occupational Therapy

## 2020-08-19 DIAGNOSIS — M6281 Muscle weakness (generalized): Secondary | ICD-10-CM | POA: Diagnosis not present

## 2020-08-19 DIAGNOSIS — M79641 Pain in right hand: Secondary | ICD-10-CM | POA: Insufficient documentation

## 2020-08-19 DIAGNOSIS — M25642 Stiffness of left hand, not elsewhere classified: Secondary | ICD-10-CM | POA: Diagnosis not present

## 2020-08-19 DIAGNOSIS — M25542 Pain in joints of left hand: Secondary | ICD-10-CM | POA: Insufficient documentation

## 2020-08-19 NOTE — Therapy (Signed)
[facility] [address] [place], [address] Phone: [phone]   Fax:  [phone]  Occupational Therapy treatment and Discharge   Patient Details  Name: Denise Torres MRN: [id number] Date of Birth: [birth year] Referring Provider (OT): Dr Posey Pronto   Encounter Date: 08/19/2020   OT End of Session - 08/19/20 1201    Visit Number 3    Number of Visits 3    Date for OT Re-Evaluation 08/19/20    OT Start Time 1120    OT Stop Time 1145    OT Time Calculation (min) 25 min    Activity Tolerance Patient tolerated treatment well    Behavior During Therapy [facility] for tasks assessed/performed           Past Medical History:  Diagnosis Date  . Arthritis    mostly in b/l hands, left knee  . Blood transfusion without reported diagnosis   . Diabetes mellitus type 2, controlled, without complications ([place]) 0/24/0973  . Diabetes mellitus without complication ([place])   . Family history of breast cancer   . Family history of uterine cancer   . Hyperlipidemia   . Lower GI bleed 09/2015    Past Surgical History:  Procedure Laterality Date  . ABDOMINAL HYSTERECTOMY    . COLONOSCOPY WITH PROPOFOL N/A 01/04/2020   Procedure: COLONOSCOPY WITH PROPOFOL;  Surgeon: Jonathon Bellows, MD;  Location: [facility] ENDOSCOPY;  Service: Gastroenterology;  Laterality: N/A;  . DIAGNOSTIC LAPAROSCOPY    . EYE SURGERY Bilateral 02/21/1998   lasik  . LAPAROSCOPIC SUPRACERVICAL HYSTERECTOMY  2010   partial  . TONSILLECTOMY  09/21/1977  . WISDOM TOOTH EXTRACTION Bilateral 1976    There were no vitals filed for this visit.   Subjective Assessment - 08/19/20 1200    Subjective  Doing very well - I am adapting how I grip , hold and carry things- even cutting my fabric - doing well -pain much better and using parafin am and pm    Pertinent History Pt seen Dr Posey Pronto - Erosive osteoarthritis of hand  - hydrOXYchloroQUINE (PLAQUENIL) 200 mg tablet; Take 1  tablet (200 mg total) by mouth once daily  - X-ray hand right minimum 3 views; Future  - X-ray hand left minimum 3 views; Future    -- She has erosive osteoarthritis of the hands. She does not have inflammatory arthritis.  -- Trial of Plaquenil 200 mg daily  -- Check hand xray  -- Refer to OT, she will let us know where to refer her in [place] she try paraffin wax    Return in about 4 months (around 11/13/2020) for Routine Follow Up    Patient Stated Goals I want to know things I can do at home to decrease pain , increase my motion and strength so I can use my hands better and with less pain    Currently in Pain? No/denies              [facility] OT Assessment - 08/19/20 0001      Strength   Right Hand Grip (lbs) 55    Right Hand Lateral Pinch 18 lbs    Right Hand 3 Point Pinch 14 lbs    Left Hand Grip (lbs) 46    Left Hand Lateral Pinch 15 lbs    Left Hand 3 Point Pinch 12 lbs      Right Hand AROM   R Index  MCP 0-90 85 Degrees  R Index PIP 0-100 90 Degrees    R Long  MCP 0-90 90 Degrees    R Long PIP 0-100 100 Degrees    R Ring  MCP 0-90 90 Degrees    R Ring PIP 0-100 100 Degrees    R Little  MCP 0-90 95 Degrees    R Little PIP 0-100 75 Degrees      Left Hand AROM   L Index  MCP 0-90 86 Degrees    L Index PIP 0-100 80 Degrees    L Long  MCP 0-90 95 Degrees    L Long PIP 0-100 70 Degrees    L Ring  MCP 0-90 86 Degrees    L Little  MCP 0-90 100 Degrees          Measurement taken for AROM in bilateral hands and grip/prehension - lat and grip increase  see flowsheet  AROM in some digits increase since SOC       Pt did get paraffin bath she uses in am and pm Tendon glides AROM - light and block wrist 10 reps Opposition to all digits  10 reps Joint protection principles - hand out review and AE education Review again with pt -she implementing some of it Also ed on using silicon sleeve for compression on some of joints if it gets inflamed or sore - can  order on [employer] And to use contrast if flare up - and then can go back to paraffin              OT Education - 08/19/20 1200    Education Details progress and HEP review    Person(s) Educated Patient    Methods Explanation;Demonstration;Tactile cues;Verbal cues;Handout    Comprehension Verbal cues required;Returned demonstration;Verbalized understanding               OT Long Term Goals - 08/19/20 1205      OT LONG TERM GOAL #1   Title Pt to be independent in HEP to increase AROM in L 2nd and 3rd digit , decrease pain with grip and 3 point pinch    Baseline decrease AROM in L 2nd and 3rd , more than R hand - no knowledge of HEP - pain 4/10 with grip , pinch or use- NOW d/c no pain this date - and show increase grip and prehension to WNL for her age -and increase AROM for some of digits    Status Achieved      OT LONG TERM GOAL #2   Title Pt to verbalize use of 3 joint protectoin or AE to decrease pain in hands and increase ease of use of hands in ADL's and IADL's    Baseline use joint protection -and got some AE for making easier - larger joints, enlarge grips -and keep pain under 2/10    Status Achieved                 Plan - 08/19/20 1201    Clinical Impression Statement Pt was refer with erositive OA with pain in bilateral hands - pt made great progress in pain and increase AROM in some of digits - with increase grip and prehension strength - WNL for her age - pt to cont with HEP using paraffin bath, HEP and joint protection to maintain her ROM and strength without increase pain -pt discharge at this time    OT Occupational Profile and History Problem Focused Assessment - Including review of records relating to presenting problem  Occupational performance deficits (Please refer to evaluation for details): ADL's;IADL's;Play;Leisure;Social Participation    Body Structure / Function / Physical Skills ADL;Coordination;Dexterity;Flexibility;ROM;UE functional  use;Pain;Strength;IADL    Rehab Potential Fair    Clinical Decision Making Limited treatment options, no task modification necessary    Comorbidities Affecting Occupational Performance: May have comorbidities impacting occupational performance    Modification or Assistance to Complete Evaluation  No modification of tasks or assist necessary to complete eval    OT Treatment/Interventions Self-care/ADL training;Paraffin;Therapeutic exercise;DME and/or AE instruction;Manual Therapy;Splinting;Patient/family education    Consulted and Agree with Plan of Care Patient           Patient will benefit from skilled therapeutic intervention in order to improve the following deficits and impairments:   Body Structure / Function / Physical Skills: ADL,Coordination,Dexterity,Flexibility,ROM,UE functional use,Pain,Strength,IADL       Visit Diagnosis: Pain in right hand  Stiffness of left hand, not elsewhere classified  Muscle weakness (generalized)  Pain in joint of left hand    Problem List Patient Active Problem List   Diagnosis Date Noted  . Polyarthralgia 05/12/2020  . Decreased range of motion 05/12/2020  . Bilateral hand pain 05/12/2020  . Family history of breast cancer   . Family history of uterine cancer   . Annual physical exam 11/02/2019  . H/O: GI bleed 03/23/2017  . HLD (hyperlipidemia) 03/23/2017  . Tobacco abuse 03/23/2017  . Colon polyps 03/23/2017  . T2DM (type 2 diabetes mellitus) ([place]) 10/11/2015    Rosalyn Gess OTR/L,CLT 08/19/2020, 12:07 PM  [facility] [address], [place], [address] Phone: [phone]   Fax:  [phone]  Name: Denise Torres MRN: [id number] Date of Birth: 1956/05/19

## 2020-10-20 ENCOUNTER — Telehealth: Payer: Self-pay

## 2020-10-20 NOTE — Telephone Encounter (Signed)
Copied from [place] [id number]. Topic: General - Other >> Oct 20, 2020  8:29 AM Leward Quan A wrote: Reason for CRM: Patient called in to say that she have pain in her left hip that radiates down the hip to her ankle. Asking for a call back from a nurse to discuss what can be done. Per patient she lost her job and her health insurance so does not want to come in for a visit unless something can be done in the office to help relieve the pain.  Not available from 10 AM to 10.30 AM please call  Ph# [phone]

## 2020-10-21 NOTE — Telephone Encounter (Signed)
Pt advised.   Thanks,   -Garlan Drewes  

## 2020-10-21 NOTE — Telephone Encounter (Signed)
Can't really say if we could do something without an evaluation, but I woul try gentle stretches, ice/heat, rest, and ibuprofen/tylenol for at home care.

## 2020-11-25 ENCOUNTER — Encounter: Payer: Self-pay | Admitting: Family Medicine

## 2021-02-02 ENCOUNTER — Ambulatory Visit (INDEPENDENT_AMBULATORY_CARE_PROVIDER_SITE_OTHER): Payer: 59 | Admitting: Family Medicine

## 2021-02-02 ENCOUNTER — Other Ambulatory Visit: Payer: Self-pay

## 2021-02-02 ENCOUNTER — Encounter: Payer: Self-pay | Admitting: Family Medicine

## 2021-02-02 VITALS — BP 110/70 | HR 72 | Temp 98.0°F | Resp 16 | Ht 65.0 in | Wt 137.6 lb

## 2021-02-02 DIAGNOSIS — E785 Hyperlipidemia, unspecified: Secondary | ICD-10-CM

## 2021-02-02 DIAGNOSIS — Z720 Tobacco use: Secondary | ICD-10-CM

## 2021-02-02 DIAGNOSIS — E1169 Type 2 diabetes mellitus with other specified complication: Secondary | ICD-10-CM | POA: Diagnosis not present

## 2021-02-02 DIAGNOSIS — M5430 Sciatica, unspecified side: Secondary | ICD-10-CM | POA: Insufficient documentation

## 2021-02-02 DIAGNOSIS — M5432 Sciatica, left side: Secondary | ICD-10-CM

## 2021-02-02 DIAGNOSIS — E1165 Type 2 diabetes mellitus with hyperglycemia: Secondary | ICD-10-CM

## 2021-02-02 DIAGNOSIS — Z0000 Encounter for general adult medical examination without abnormal findings: Secondary | ICD-10-CM | POA: Diagnosis not present

## 2021-02-02 DIAGNOSIS — Z1231 Encounter for screening mammogram for malignant neoplasm of breast: Secondary | ICD-10-CM

## 2021-02-02 MED ORDER — CYCLOBENZAPRINE HCL 5 MG PO TABS: 5.0000 mg | ORAL_TABLET | Freq: Three times a day (TID) | ORAL | 1 refills | Status: DC | PRN

## 2021-02-02 MED ORDER — PIOGLITAZONE HCL 30 MG PO TABS: 30.0000 mg | ORAL_TABLET | Freq: Every day | ORAL | 1 refills | Status: DC

## 2021-02-02 MED ORDER — GABAPENTIN 300 MG PO CAPS: 300.0000 mg | ORAL_CAPSULE | Freq: Every day | ORAL | 3 refills | Status: DC

## 2021-02-02 NOTE — Assessment & Plan Note (Signed)
-   Discussed Shingles vaccine, pt. wishes to check on insurance coverage first - Referral for screening mammo - Scheduled for annual eye exam later this month - Check CMP, lipids, A1c - Discussed diet and exercise

## 2021-02-02 NOTE — Assessment & Plan Note (Signed)
-   Chronic, previously stable - Scheduled for annual eye exam later this month - Continue Actos & Metformin - Recheck A1c

## 2021-02-02 NOTE — Assessment & Plan Note (Signed)
-   Chronic, previously stable - Recheck lipids - Continue Atorvastatin

## 2021-02-02 NOTE — Assessment & Plan Note (Signed)
-   Acute, poorly controlled - Start Gabapentin & Flexeril - May consider PM&R referral if problem persists or worsens - Encouraged pt. to continue doing PT exercises & using topical heat/ice prn for pain relief

## 2021-02-02 NOTE — Progress Notes (Signed)
Annual Wellness Visit     Patient: Denise Torres, Female    DOB: June 24, 1955, 65 y.o.   MRN: [id number] Visit Date: 02/02/2021  Today's Provider: [name], MD   Chief Complaint  Patient presents with   Annual Exam   Subjective    Denise Torres is a 65 y.o. female who presents today for her Annual Wellness Visit. She reports consuming a general diet. Exercise is limited by sciatica. She generally feels fairly well. She reports sleeping well. She does have additional problems to discuss today.   HPI  Sciatica, L leg - Pt. reports persistent sciatica in L leg since April - Pt. states that she previously saw [facility]; gave her a steroid injection, but pain returned in 2 days - Previously saw PT, which she felt was helpful; she discontinued PT in June, was told that her symptoms are "stable" - Pt. reports that she has applied heat & ice intermittently, but has not tried any other methods or medications for pain relief  Diabetes - Currently on Metformin & Actos; pt. has been out of Actos for ~2 weeks - Due for annual eye exam  Hyperlipidemia - Currently on Atorvastatin; denies side effects  Health Maintenance - Due for Shingles vaccine, COVID booster, mammo, & annual eye exam  Medications: Outpatient Medications Prior to Visit  Medication Sig   atorvastatin (LIPITOR) 40 MG tablet Take 1 tablet (40 mg total) by mouth daily.   Cholecalciferol 25 MCG (1000 UT) tablet Take 1 tablet by mouth daily.   hydroxychloroquine (PLAQUENIL) 200 MG tablet Take 200 mg by mouth daily.   metFORMIN (GLUCOPHAGE) 500 MG tablet Take 2 tablets (1,000 mg total) by mouth 2 (two) times daily.   Vitamin Mixture (VITAMIN E COMPLETE PO) Take 1 tablet by mouth daily.   [DISCONTINUED] pioglitazone (ACTOS) 30 MG tablet Take 1 tablet (30 mg total) by mouth daily.   No facility-administered medications prior to visit.    No Known Allergies  Patient Care Team: Virginia Crews, MD as  PCP - General (Family Medicine)  Review of Systems  Constitutional:  Positive for activity change and fatigue. Negative for chills and fever.  HENT: Negative.    Eyes: Negative.   Respiratory: Negative.  Negative for chest tightness and shortness of breath.   Cardiovascular: Negative.  Negative for chest pain and leg swelling.  Gastrointestinal: Negative.   Endocrine: Negative.   Genitourinary: Negative.   Musculoskeletal:  Positive for myalgias.       Sciatica in L leg  Skin: Negative.   Psychiatric/Behavioral: Negative.          Objective    Vitals: BP 110/70 (BP Location: Left Arm, Patient Position: Sitting, Cuff Size: Normal)   Pulse 72   Temp 98 F (36.7 C) (Oral)   Resp 16   Ht '5\' 5"'$  (1.651 m)   Wt 137 lb 9.6 oz (62.4 kg)   BMI 22.90 kg/m     Physical Exam Constitutional:      Appearance: Normal appearance. She is normal weight.  HENT:     Head: Normocephalic and atraumatic.     Right Ear: External ear normal.     Left Ear: External ear normal.  Eyes:     Conjunctiva/sclera: Conjunctivae normal.  Cardiovascular:     Rate and Rhythm: Normal rate and regular rhythm.     Pulses: Normal pulses.     Heart sounds: Normal heart sounds.  Pulmonary:     Effort: Pulmonary effort is  normal.     Breath sounds: Normal breath sounds.  Chest:  Breasts:    Right: Normal.     Left: Normal.  Abdominal:     General: Abdomen is flat. Bowel sounds are normal.     Palpations: Abdomen is soft.  Musculoskeletal:        General: Tenderness present.     Right lower leg: No edema.     Left lower leg: No edema.  Lymphadenopathy:     Upper Body:     Right upper body: No axillary adenopathy.     Left upper body: No axillary adenopathy.  Skin:    General: Skin is warm and dry.  Neurological:     Mental Status: She is alert.     Sensory: No sensory deficit.     Comments: Tenderness present in posterior L leg, sciatic nerve distribution  Psychiatric:        Behavior:  Behavior normal.        Thought Content: Thought content normal.    Most recent functional status assessment: In your present state of health, do you have any difficulty performing the following activities: 02/02/2021  Hearing? N  Vision? N  Difficulty concentrating or making decisions? N  Walking or climbing stairs? N  Dressing or bathing? N  Doing errands, shopping? N  Some recent data might be hidden   Most recent fall risk assessment: Fall Risk  02/02/2021  Falls in the past year? 0  Number falls in past yr: 0  Injury with Fall? 0  Risk for fall due to : No Fall Risks  Follow up Falls evaluation completed    Most recent depression screenings: PHQ 2/9 Scores 02/02/2021 08/11/2020  PHQ - 2 Score 0 0  PHQ- 9 Score 0 0   Most recent cognitive screening: No flowsheet data found. Most recent Audit-C alcohol use screening Alcohol Use Disorder Test (AUDIT) 02/02/2021  1. How often do you have a drink containing alcohol? 1  2. How many drinks containing alcohol do you have on a typical day when you are drinking? 0  3. How often do you have six or more drinks on one occasion? 0  AUDIT-C Score 1  Alcohol Brief Interventions/Follow-up -   A score of 3 or more in women, and 4 or more in men indicates increased risk for alcohol abuse, EXCEPT if all of the points are from question 1   No results found for any visits on 02/02/21.  Assessment & Plan     Annual wellness visit done today including the all of the following: Reviewed patient's Family Medical History Reviewed and updated list of patient's medical providers Assessment of cognitive impairment was done Assessed patient's functional ability Established a written schedule for health screening [place] Completed and Reviewed  Exercise Activities and Dietary recommendations  Goals   None     Immunization History  Administered Date(s) Administered   Influenza,inj,Quad PF,6+ Mos 03/23/2017,  04/19/2018, 02/02/2019, 05/12/2020   PFIZER(Purple Top)SARS-COV-2 Vaccination 09/03/2019, 10/01/2019   Pneumococcal Polysaccharide-23 04/19/2018   Tdap 09/20/2009, 03/30/2014    Health Maintenance  Topic Date Due   Zoster Vaccines- Shingrix (1 of 2) Never done   Pneumococcal Vaccine 33-59 Years old (2 - PCV) 04/20/2019   COVID-19 Vaccine (3 - Pfizer risk series) 10/29/2019   INFLUENZA VACCINE  01/12/2021   OPHTHALMOLOGY EXAM  01/12/2021   HEMOGLOBIN A1C  02/08/2021   FOOT EXAM  08/11/2021   URINE MICROALBUMIN  08/11/2021  MAMMOGRAM  12/06/2021   COLONOSCOPY (Pts 45-38yr Insurance coverage will need to be confirmed)  01/04/2023   TETANUS/TDAP  03/30/2024   PNEUMOCOCCAL POLYSACCHARIDE VACCINE AGE 23-64 HIGH RISK  Completed   Hepatitis C Screening  Completed   HIV Screening  Completed   HPV VACCINES  Aged Out     Discussed health benefits of physical activity, and encouraged her to engage in regular exercise appropriate for her age and condition.    Problem List Items Addressed This Visit       Endocrine   T2DM (type 2 diabetes mellitus) ([place]    - Chronic, previously stable - Scheduled for annual eye exam later this month - Continue Actos & Metformin - Recheck A1c      Relevant Medications   pioglitazone (ACTOS) 30 MG tablet   Other Relevant Orders   Hemoglobin A1c   Hyperlipidemia associated with type 2 diabetes mellitus (HCC)    - Chronic, previously stable - Recheck lipids - Continue Atorvastatin      Relevant Medications   pioglitazone (ACTOS) 30 MG tablet   Other Relevant Orders   Comprehensive metabolic panel   Lipid panel     Nervous and Auditory   Sciatica    - Acute, poorly controlled - Start Gabapentin & Flexeril - May consider PM&R referral if problem persists or worsens - Encouraged pt. to continue doing PT exercises & using topical heat/ice prn for pain relief      Relevant Medications   gabapentin (NEURONTIN) 300 MG capsule    cyclobenzaprine (FLEXERIL) 5 MG tablet     Other   Tobacco abuse    - Discussed tobacco cessation - Pt. states that she is not ready to quit tobacco use      Annual physical exam - Primary    - Discussed Shingles vaccine, pt. wishes to check on insurance coverage first - Referral for screening mammo - Scheduled for annual eye exam later this month - Check CMP, lipids, A1c - Discussed diet and exercise      Relevant Orders   Hemoglobin A1c   Comprehensive metabolic panel   Lipid panel   MM 3D SCREEN BREAST BILATERAL   Other Visit Diagnoses     Encounter for screening mammogram for malignant neoplasm of breast       Relevant Orders   MM 3D SCREEN BREAST BILATERAL        Return in about 3 months (around 05/05/2021) for chronic disease f/u.       MPercell Locus MS3    Patient seen along with MS3 student MPercell Locus I personally evaluated this patient along with the student, and verified all aspects of the history, physical exam, and medical decision making as documented by the student. I agree with the student's documentation and have made all necessary edits.  Serenah Mill, ADionne Bucy MD, MPH [facility]

## 2021-02-02 NOTE — Assessment & Plan Note (Signed)
-   Discussed tobacco cessation - Pt. states that she is not ready to quit tobacco use

## 2021-02-02 NOTE — Patient Instructions (Signed)
   The CDC recommends two doses of Shingrix (the shingles vaccine) separated by 2 to 6 months for adults age 63 years and older. I recommend checking with your insurance plan regarding coverage for this vaccine.   

## 2021-03-01 ENCOUNTER — Encounter: Payer: Self-pay | Admitting: Family Medicine

## 2021-03-02 MED ORDER — CYCLOBENZAPRINE HCL 5 MG PO TABS: 5.0000 mg | ORAL_TABLET | Freq: Three times a day (TID) | ORAL | 0 refills | Status: DC | PRN

## 2021-03-03 LAB — HM DIABETES EYE EXAM

## 2021-03-07 LAB — COMPREHENSIVE METABOLIC PANEL
ALT: 24 IU/L (ref 0–32)
AST: 27 IU/L (ref 0–40)
Albumin/Globulin Ratio: 2.5 — ABNORMAL HIGH (ref 1.2–2.2)
Albumin: 4.9 g/dL — ABNORMAL HIGH (ref 3.8–4.8)
Alkaline Phosphatase: 96 IU/L (ref 44–121)
BUN/Creatinine Ratio: 11 — ABNORMAL LOW (ref 12–28)
BUN: 9 mg/dL (ref 8–27)
Bilirubin Total: 0.3 mg/dL (ref 0.0–1.2)
CO2: 24 mmol/L (ref 20–29)
Calcium: 9.2 mg/dL (ref 8.7–10.3)
Chloride: 101 mmol/L (ref 96–106)
Creatinine, Ser: 0.8 mg/dL (ref 0.57–1.00)
Globulin, Total: 2 g/dL (ref 1.5–4.5)
Glucose: 151 mg/dL — ABNORMAL HIGH (ref 65–99)
Potassium: 4.1 mmol/L (ref 3.5–5.2)
Sodium: 141 mmol/L (ref 134–144)
Total Protein: 6.9 g/dL (ref 6.0–8.5)
eGFR: 83 mL/min/{1.73_m2} (ref >59)

## 2021-03-07 LAB — LIPID PANEL
Chol/HDL Ratio: 1.9 ratio (ref 0.0–4.4)
Cholesterol, Total: 145 mg/dL (ref 100–199)
HDL: 75 mg/dL (ref >39)
LDL Chol Calc (NIH): 59 mg/dL (ref 0–99)
Triglycerides: 53 mg/dL (ref 0–149)
VLDL Cholesterol Cal: 11 mg/dL (ref 5–40)

## 2021-03-07 LAB — HEMOGLOBIN A1C
Est. average glucose Bld gHb Est-mCnc: 151 mg/dL
Hgb A1c MFr Bld: 6.9 % — ABNORMAL HIGH (ref 4.8–5.6)

## 2021-05-27 ENCOUNTER — Ambulatory Visit
Admission: RE | Admit: 2021-05-27 | Discharge: 2021-05-27 | Disposition: A | Discharge status: Home / Self Care | Payer: 59 | Source: Ambulatory Visit | Attending: Family Medicine | Admitting: Family Medicine

## 2021-05-27 ENCOUNTER — Other Ambulatory Visit: Payer: Self-pay

## 2021-05-27 DIAGNOSIS — Z0000 Encounter for general adult medical examination without abnormal findings: Secondary | ICD-10-CM

## 2021-05-27 DIAGNOSIS — Z1231 Encounter for screening mammogram for malignant neoplasm of breast: Secondary | ICD-10-CM

## 2021-05-28 ENCOUNTER — Ambulatory Visit (INDEPENDENT_AMBULATORY_CARE_PROVIDER_SITE_OTHER): Payer: 59 | Admitting: Family Medicine

## 2021-05-28 ENCOUNTER — Encounter: Payer: Self-pay | Admitting: Family Medicine

## 2021-05-28 VITALS — BP 123/78 | HR 76 | Resp 16 | Wt 142.0 lb

## 2021-05-28 DIAGNOSIS — E785 Hyperlipidemia, unspecified: Secondary | ICD-10-CM | POA: Diagnosis not present

## 2021-05-28 DIAGNOSIS — E119 Type 2 diabetes mellitus without complications: Secondary | ICD-10-CM | POA: Diagnosis not present

## 2021-05-28 DIAGNOSIS — E1169 Type 2 diabetes mellitus with other specified complication: Secondary | ICD-10-CM

## 2021-05-28 DIAGNOSIS — Z720 Tobacco use: Secondary | ICD-10-CM | POA: Diagnosis not present

## 2021-05-28 NOTE — Assessment & Plan Note (Signed)
Discussion regarding the harms of tobacco use, the benefits of cessation, and methods of cessation Patient is currently precontemplative  Will reassess at next visit  

## 2021-05-28 NOTE — Assessment & Plan Note (Signed)
Previously well controlled Continue statin Repeat FLP and CMP annually 

## 2021-05-28 NOTE — Progress Notes (Signed)
Established patient visit   Patient: Denise Torres   DOB: July 27, 1955   65 y.o. Female  MRN: [id number] Visit Date: 05/28/2021  Today's healthcare provider: [name], MD   Chief Complaint  Patient presents with   Follow-up   Subjective    HPI HPI   chronic disease  Last edited by Doristine Devoid, CMA on 05/28/2021  3:53 PM.      Patient for 3 month follow up DM.    Diabetes Mellitus Type II, follow-up  Lab Results  Component Value Date   HGBA1C 6.9 (H) 03/06/2021   HGBA1C 7.9 (A) 08/11/2020   HGBA1C 8.4 (H) 05/15/2020   Last seen for diabetes 3 months ago.  Management since then includes Scheduled for annual eye exam later this month - Continue Actos & Metformin - Recheck A1c She reports excellent compliance with treatment. She is not having side effects.   Home blood sugar records:  not being checked  Most Recent Eye Exam: 03/03/2021 [facility]-No Retinopathy  ---------------------------------------------------------------------------------------------------  Lipid/Cholesterol, follow-up  Last Lipid Panel: Lab Results  Component Value Date   CHOL 145 03/06/2021   LDLCALC 59 03/06/2021   HDL 75 03/06/2021   TRIG 53 03/06/2021    She was last seen for this 3 months ago.  Management since that visit includes Continue Atorvastatin.  She reports excellent compliance with treatment. She is not having side effects.   Symptoms: No appetite changes No foot ulcerations  No chest pain No chest pressure/discomfort  No dyspnea No orthopnea  No fatigue No lower extremity edema  No palpitations No paroxysmal nocturnal dyspnea  No nausea No numbness or tingling of extremity  No polydipsia No polyuria  No speech difficulty No syncope      Last metabolic panel Lab Results  Component Value Date   GLUCOSE 151 (H) 03/06/2021   NA 141 03/06/2021   K 4.1 03/06/2021   BUN 9 03/06/2021   CREATININE 0.80 03/06/2021   EGFR 83 03/06/2021   GFRNONAA 73  05/15/2020   CALCIUM 9.2 03/06/2021   AST 27 03/06/2021   ALT 24 03/06/2021   The 10-year ASCVD risk score (Arnett DK, et al., 2019) is: 10.6%  ---------------------------------------------------------------------------------------------------    Sciatica has improved.  Medications: Outpatient Medications Prior to Visit  Medication Sig   atorvastatin (LIPITOR) 40 MG tablet Take 1 tablet (40 mg total) by mouth daily.   Cholecalciferol 25 MCG (1000 UT) tablet Take 1 tablet by mouth daily.   metFORMIN (GLUCOPHAGE) 500 MG tablet Take 2 tablets (1,000 mg total) by mouth 2 (two) times daily.   pioglitazone (ACTOS) 30 MG tablet Take 1 tablet (30 mg total) by mouth daily.   Vitamin Mixture (VITAMIN E COMPLETE PO) Take 1 tablet by mouth daily.   [DISCONTINUED] cyclobenzaprine (FLEXERIL) 5 MG tablet Take 1 tablet (5 mg total) by mouth 3 (three) times daily as needed for muscle spasms. (Patient not taking: Reported on 05/28/2021)   [DISCONTINUED] gabapentin (NEURONTIN) 300 MG capsule Take 1 capsule (300 mg total) by mouth at bedtime. (Patient not taking: Reported on 05/28/2021)   [DISCONTINUED] hydroxychloroquine (PLAQUENIL) 200 MG tablet Take 200 mg by mouth daily. (Patient not taking: Reported on 05/28/2021)   No facility-administered medications prior to visit.    Review of Systems  Constitutional: Negative.   Respiratory: Negative.    Cardiovascular: Negative.   Neurological: Negative.       Objective    BP 123/78 (BP Location: Left Arm, Patient Position: Sitting, Cuff  Size: Normal)    Pulse 76    Resp 16    Wt 142 lb (64.4 kg)    BMI 23.63 kg/m  {Show previous vital signs (optional):23777}  Physical Exam Vitals reviewed.  Constitutional:      General: She is not in acute distress.    Appearance: Normal appearance. She is well-developed. She is not diaphoretic.  HENT:     Head: Normocephalic and atraumatic.  Eyes:     General: No scleral icterus.    Conjunctiva/sclera:  Conjunctivae normal.  Neck:     Thyroid: No thyromegaly.  Cardiovascular:     Rate and Rhythm: Normal rate and regular rhythm.     Pulses: Normal pulses.     Heart sounds: Normal heart sounds. No murmur heard. Pulmonary:     Effort: Pulmonary effort is normal. No respiratory distress.     Breath sounds: Normal breath sounds. No wheezing, rhonchi or rales.  Musculoskeletal:     Cervical back: Neck supple.     Right lower leg: No edema.     Left lower leg: No edema.  Lymphadenopathy:     Cervical: No cervical adenopathy.  Skin:    General: Skin is warm and dry.     Findings: No rash.  Neurological:     Mental Status: She is alert and oriented to person, place, and time. Mental status is at baseline.  Psychiatric:        Mood and Affect: Mood normal.        Behavior: Behavior normal.      No results found for any visits on 05/28/21.  Assessment & Plan     Problem List Items Addressed This Visit       Endocrine   T2DM (type 2 diabetes mellitus) ([place]) - Primary    Previously well controlled      Relevant Orders   Hemoglobin A1c   Hyperlipidemia associated with type 2 diabetes mellitus ([place])    Previously well controlled Continue statin Repeat FLP and CMP annually        Other   Tobacco abuse    Discussion regarding the harms of tobacco use, the benefits of cessation, and methods of cessation Patient is currently precontemplative  Will reassess at next visit       Relevant Orders   Ambulatory Referral Lung Cancer Screening [facility]     Return in about 6 months (around 11/26/2021) for chronic disease f/u.      I, [name], MD, have reviewed all documentation for this visit. The documentation on 05/28/21 for the exam, diagnosis, procedures, and orders are all accurate and complete.   Vanessa Kampf, Dionne Bucy, MD, MPH [facility]

## 2021-05-28 NOTE — Assessment & Plan Note (Signed)
Previously well controlled

## 2021-06-13 LAB — HEMOGLOBIN A1C
Est. average glucose Bld gHb Est-mCnc: 157 mg/dL
Hgb A1c MFr Bld: 7.1 % — ABNORMAL HIGH (ref 4.8–5.6)

## 2021-06-29 ENCOUNTER — Ambulatory Visit: Payer: Self-pay

## 2021-06-29 DIAGNOSIS — Z03818 Encounter for observation for suspected exposure to other biological agents ruled out: Secondary | ICD-10-CM | POA: Diagnosis not present

## 2021-06-29 DIAGNOSIS — Z20822 Contact with and (suspected) exposure to covid-19: Secondary | ICD-10-CM | POA: Diagnosis not present

## 2021-06-29 NOTE — Telephone Encounter (Signed)
[facility] phone number given to patient.

## 2021-06-29 NOTE — Telephone Encounter (Signed)
Please give her the phone number for [facility] pharmacy for pharmacist to review med list and Rx Paxlovid.  °

## 2021-06-29 NOTE — Telephone Encounter (Signed)
Summary: advice - Covid and possible flu   Pt stated she just tested positive for COVID yesterday, pt thinks she might have the flu as well, pt was not sure what to do and is overall feeling unwell.      Chief Complaint: Positive home COVID 19 test Symptoms: Cough, congestion, body aches, fatigue. "I feel like I have the flu too." Frequency: Started Saturday. Pertinent Negatives: Patient denies fever. Disposition: [] ED /[] Urgent Care (no appt availability in office) / [] Appointment(In office/virtual)/ []  [facility]/ [] Home Care/ [] Refused Recommended Disposition /[] [facility]/ []  Follow-up with PCP Additional Notes: Pt. States she will go to [facility] for further testing and call back with results and further recommendations. Answer Assessment - Initial Assessment Questions 1. COVID-19 DIAGNOSIS: "Who made your COVID-19 diagnosis?" "Was it confirmed by a positive lab test or self-test?" If not diagnosed by a doctor (or NP/PA), ask "Are there lots of cases (community spread) where you live?" Note: See public health department website, if unsure.     Home test 2. COVID-19 EXPOSURE: "Was there any known exposure to COVID before the symptoms began?" CDC Definition of close contact: within 6 feet (2 meters) for a total of 15 minutes or more over a 24-hour period.      No 3. ONSET: "When did the COVID-19 symptoms start?"      Saturday 4. WORST SYMPTOM: "What is your worst symptom?" (e.g., cough, fever, shortness of breath, muscle aches)     Body aches 5. COUGH: "Do you have a cough?" If Yes, ask: "How bad is the cough?"       Cough 6. FEVER: "Do you have a fever?" If Yes, ask: "What is your temperature, how was it measured, and when did it start?"     No 7. RESPIRATORY STATUS: "Describe your breathing?" (e.g., shortness of breath, wheezing, unable to speak)      No 8. BETTER-SAME-WORSE: "Are you getting better, staying the same or getting worse compared to  yesterday?"  If getting worse, ask, "In what way?"     Worse 9. HIGH RISK DISEASE: "Do you have any chronic medical problems?" (e.g., asthma, heart or lung disease, weak immune system, obesity, etc.)     Diabetes 10. VACCINE: "Have you had the COVID-19 vaccine?" If Yes, ask: "Which one, how many shots, when did you get it?"       Yes 11. BOOSTER: "Have you received your COVID-19 booster?" If Yes, ask: "Which one and when did you get it?"       Yes 12. PREGNANCY: "Is there any chance you are pregnant?" "When was your last menstrual period?"       No 13. OTHER SYMPTOMS: "Do you have any other symptoms?"  (e.g., chills, fatigue, headache, loss of smell or taste, muscle pain, sore throat)       Fatigue 14. O2 SATURATION MONITOR:  "Do you use an oxygen saturation monitor (pulse oximeter) at home?" If Yes, ask "What is your reading (oxygen level) today?" "What is your usual oxygen saturation reading?" (e.g., 95%)       No  Protocols used: Coronavirus (COVID-19) Diagnosed or Suspected-A-AH

## 2021-06-30 ENCOUNTER — Other Ambulatory Visit: Payer: Self-pay

## 2021-06-30 MED ORDER — PAXLOVID (300/100) 20 X 150 MG & 10 X 100MG PO TBPK
ORAL_TABLET | ORAL | 0 refills | Status: DC
  Filled 2021-06-30: qty 30, 5d supply, fill #0

## 2021-07-01 ENCOUNTER — Other Ambulatory Visit: Payer: Self-pay

## 2021-07-01 ENCOUNTER — Telehealth: Payer: Self-pay | Admitting: Pharmacist

## 2021-07-01 NOTE — Telephone Encounter (Signed)
Outpatient Pharmacy Oral COVID Treatment Note  I connected with Denise Torres on 07/01/2021/12:33 PM by telephone and verified that I am speaking with the correct person using two identifiers.  I discussed the limitations, risks, security, and privacy concerns of performing an evaluation and management service by telephone and the availability of in person appointments via referral to a physician. The patient expressed understanding and agreed to proceed.  Pharmacy location: [facility]  Diagnosis: COVID-19 infection  Purpose of visit: Discussion of potential use of Paxlovid, a new treatment for mild to moderate COVID-19 viral infection in non-hospitalized patients.  Subjective/Objective: Patient is a 66 y.o. female who is presenting with COVID 19 viral infection.  COVID 19 viral infection. Their symptoms began on 06/27/21 with fatigue, body aches cold chills.  The patient has confirmed COVID-19 via a home test on 06/28/21 and PCR test 06/29/21.   Past Medical History:  Diagnosis Date   Arthritis    mostly in b/l hands, left knee   Blood transfusion without reported diagnosis    Diabetes mellitus type 2, controlled, without complications ([place]) 02/06/36   Diabetes mellitus without complication ([place])    Family history of breast cancer    Family history of uterine cancer    Hyperlipidemia    Lower GI bleed 09/2015    No Known Allergies   Current Outpatient Medications:    atorvastatin (LIPITOR) 40 MG tablet, Take 1 tablet (40 mg total) by mouth daily., Disp: 90 tablet, Rfl: 3   Cholecalciferol 25 MCG (1000 UT) tablet, Take 1 tablet by mouth daily., Disp: , Rfl:    metFORMIN (GLUCOPHAGE) 500 MG tablet, Take 2 tablets (1,000 mg total) by mouth 2 (two) times daily., Disp: 360 tablet, Rfl: 3   nirmatrelvir & ritonavir (PAXLOVID, 300/100,) 20 x 150 MG & 10 x $Re'100MG'JQJ$  TBPK, Take 3 tablets by mouth twice a day for 5 days. Stop Lipitor for 10 days., Disp: 30 tablet, Rfl: 0    pioglitazone (ACTOS) 30 MG tablet, Take 1 tablet (30 mg total) by mouth daily., Disp: 90 tablet, Rfl: 1   Vitamin Mixture (VITAMIN E COMPLETE PO), Take 1 tablet by mouth daily., Disp: , Rfl:   Lab Monitoring: eGFR 83  Drug Interactions Noted: Patient was counseled to stop Atorvastatin for 10 days.  Plan:  This patient is a 66 y.o. female that meets the criteria for Emergency Use Authorization of Paxlovid. After reviewing the emergency use authorization with the patient, the patient agrees to receive Paxlovid.  Through FDA guidance and current [place] standing order Paxlovid will be prescribed to the patient.   Patient contacted for counseling on 06/30/2021 and verbalized understanding.   Delivery or Pick-Up Date: 07/01/2021  Follow up instructions:    Take prescription BID x 5 days as directed Counseling was provided by pharmacist. Reach out to pharmacist with follow up questions For concerns regarding further COVID symptoms please follow up with your PCP or urgent care For urgent or life-threatening issues, seek care at your local emergency department   Letta Median 07/01/2021, 12:33 PM [facility] Pharmacist Phone# [phone]

## 2021-07-02 ENCOUNTER — Other Ambulatory Visit: Payer: Self-pay

## 2021-07-02 ENCOUNTER — Encounter: Payer: Self-pay | Admitting: *Deleted

## 2021-07-02 ENCOUNTER — Ambulatory Visit: Payer: Self-pay | Admitting: *Deleted

## 2021-07-02 NOTE — Telephone Encounter (Signed)
Noted  

## 2021-07-02 NOTE — Telephone Encounter (Signed)
It does taste and smell bad. It is better to finish the course to avoid rebound symptoms if at all possible. If not possible, then its not possible.

## 2021-07-02 NOTE — Telephone Encounter (Signed)
FYI

## 2021-07-02 NOTE — Telephone Encounter (Signed)
Patient returned call and reports she is not able to complete course of paxlovid due to taste and making her "throw up" even when taking with food. Continues with congestion in head but is feel "some" better. Encouraged patient to call back if symptoms worsen or go to ED if breathing issues noted. Patient verbalized understanding.

## 2021-07-02 NOTE — Telephone Encounter (Signed)
This encounter was created in error - please disregard.

## 2021-07-02 NOTE — Telephone Encounter (Signed)
Summary: covid dx and med question   Patient called in was diagnosed with covid Tuesday, she was given an antiviral medicine , plaxovid,that smells bad she says and making her gag.     Patient wants to know if she can stop medication. Reason for Disposition  [1] Caller has URGENT medicine question about med that PCP or specialist prescribed AND [2] triager unable to answer question  Answer Assessment - Initial Assessment Questions 1. NAME of MEDICATION: "What medicine are you calling about?"     Paxlovid- first and second dose only- vomited first dose 2. QUESTION: "What is your question?" (e.g., double dose of medicine, side effect)     Unable to tolerate medication- is there alternative 3. PRESCRIBING HCP: "Who prescribed it?" Reason: if prescribed by specialist, call should be referred to that group.     PCP 4. SYMPTOMS: "Do you have any symptoms?"     Rancid taste in mouth- patient has tried brushing, cleaning sinuses, chewing gum 5. SEVERITY: If symptoms are present, ask "Are they mild, moderate or severe?"     Severe SE- Patient states she is better- she is improving with her COVID symptoms- still has congestion- no body aches, fever  Protocols used: Medication Question Call-A-AH

## 2021-07-14 ENCOUNTER — Ambulatory Visit: Payer: Self-pay

## 2021-07-14 NOTE — Telephone Encounter (Signed)
°  Chief Complaint: post COVID, congestion and SOB at times Symptoms: congestion, cough, and feels like having to breathe more than normal but isn't painful Frequency: going on for 2 weeks with COVID Pertinent Negatives: Patient denies chest pain  Disposition: [] ED /[] Urgent Care (no appt availability in office) / [x] Appointment(In office/virtual)/ []  [facility]/ [] Home Care/ [] Refused Recommended Disposition /[] [facility]/ []  Follow-up with PCP Additional Notes: Pt has been taking Mucinex and Nyquil for symptoms d/t antiviral made her sick and was advised to stop taking it. Pt isn't sure if she has PNA or bronchitis at this point.    Reason for Disposition  [1] MILD difficulty breathing (e.g., minimal/no SOB at rest, SOB with walking, pulse <100) AND [2] NEW-onset or WORSE than normal  Answer Assessment - Initial Assessment Questions 1. RESPIRATORY STATUS: "Describe your breathing?" (e.g., wheezing, shortness of breath, unable to speak, severe coughing)      Feels like breathing more than normal  2. ONSET: "When did this breathing problem begin?"      Been going on for 2 weeks 3. PATTERN "Does the difficult breathing come and go, or has it been constant since it started?"      comes 4. SEVERITY: "How bad is your breathing?" (e.g., mild, moderate, severe)    - MILD: No SOB at rest, mild SOB with walking, speaks normally in sentences, can lie down, no retractions, pulse < 100.    - MODERATE: SOB at rest, SOB with minimal exertion and prefers to sit, cannot lie down flat, speaks in phrases, mild retractions, audible wheezing, pulse 100-120.    - SEVERE: Very SOB at rest, speaks in single words, struggling to breathe, sitting hunched forward, retractions, pulse > 120      Mild  7. LUNG HISTORY: "Do you have any history of lung disease?"  (e.g., pulmonary embolus, asthma, emphysema)     No 9. OTHER SYMPTOMS: "Do you have any other symptoms? (e.g., dizziness, runny nose,  cough, chest pain, fever)     Cough and congestion still but improving slightly  Protocols used: Breathing Difficulty-A-AH

## 2021-07-15 ENCOUNTER — Encounter: Payer: Self-pay | Admitting: Family Medicine

## 2021-07-15 ENCOUNTER — Telehealth: Payer: BC Managed Care – PPO | Admitting: Family Medicine

## 2021-07-15 DIAGNOSIS — U071 COVID-19: Secondary | ICD-10-CM | POA: Diagnosis not present

## 2021-07-15 NOTE — Progress Notes (Signed)
MyChart Video Visit    Virtual Visit via Video Note   This visit type was conducted due to national recommendations for restrictions regarding the COVID-19 Pandemic (e.g. social distancing) in an effort to limit this patient's exposure and mitigate transmission in our community. This patient is at least at moderate risk for complications without adequate follow up. This format is felt to be most appropriate for this patient at this time. Physical exam was limited by quality of the video and audio technology used for the visit.   Patient location: home  Provider location: [facility]  I discussed the limitations of evaluation and management by telemedicine and the availability of in person appointments. The patient expressed understanding and agreed to proceed.  Patient: Denise Torres   DOB: 09/25/1955   66 y.o. Female  MRN: [id number] Visit Date: 07/15/2021  Today's healthcare provider: Lelon Huh, MD   Chief Complaint  Patient presents with   Shortness of Breath   Subjective      Patient reports she tested positive for COVID 2 weeks ago. Had sweats, chills, body aches, fatigue when first diagnoses, which have otherwise resolved.  She was prescribed antiviral which made her sick, so she stopped taking it. Patient has been taking Mucinex and NyQuil. She had negative follow up Covid test 3 days ago. States she was feeling better every day, but 2 days ago starting feeling extremely fatigued again. Cough and congestion is getting better.  Is not having shortness of breath and only having mild non productive cough.     Medications: Outpatient Medications Prior to Visit  Medication Sig   atorvastatin (LIPITOR) 40 MG tablet Take 1 tablet (40 mg total) by mouth daily.   Cholecalciferol 25 MCG (1000 UT) tablet Take 1 tablet by mouth daily.   metFORMIN (GLUCOPHAGE) 500 MG tablet Take 2 tablets (1,000 mg total) by mouth 2 (two) times daily.   pioglitazone (ACTOS) 30 MG tablet Take 1 tablet  (30 mg total) by mouth daily.   Vitamin Mixture (VITAMIN E COMPLETE PO) Take 1 tablet by mouth daily.   [DISCONTINUED] nirmatrelvir & ritonavir (PAXLOVID, 300/100,) 20 x 150 MG & 10 x 100MG  TBPK Take 3 tablets by mouth twice a day for 5 days. Stop Lipitor for 10 days. (Patient not taking: Reported on 07/15/2021)   No facility-administered medications prior to visit.    Review of Systems  Constitutional:  Positive for fatigue. Negative for appetite change, chills and fever.  HENT:  Positive for congestion (head congestion), postnasal drip, sinus pressure and sneezing.   Respiratory:  Positive for cough (productive with clear sputum) and shortness of breath. Negative for chest tightness and wheezing.   Cardiovascular:  Negative for chest pain and palpitations.  Gastrointestinal:  Negative for abdominal pain, nausea and vomiting.  Musculoskeletal:  Positive for myalgias.  Neurological:  Positive for headaches. Negative for dizziness and weakness.     Objective    There were no vitals taken for this visit.   Physical Exam   Awake, alert, oriented x 3. In no apparent distress. No coughing or dyspnea.    Assessment & Plan    1. COVID-19 Now 2 weeks ago was very fatigued last few days, but feels much better today with no residual symptoms. Advised to call if any new worsening sx.         I discussed the assessment and treatment plan with the patient. The patient was provided an opportunity to ask questions and all were answered. The patient  agreed with the plan and demonstrated an understanding of the instructions.   The patient was advised to call back or seek an in-person evaluation if the symptoms worsen or if the condition fails to improve as anticipated.  I provided 9 minutes of non-face-to-face time during this encounter.  The entirety of the information documented in the History of Present Illness, Review of Systems and Physical Exam were personally obtained by me. Portions of  this information were initially documented by the CMA and reviewed by me for thoroughness and accuracy.    Lelon Huh, MD [facility] [phone] (phone) [phone] (fax)  [place]

## 2021-10-05 ENCOUNTER — Encounter: Payer: Self-pay | Admitting: Family Medicine

## 2021-10-06 ENCOUNTER — Other Ambulatory Visit: Payer: Self-pay | Admitting: *Deleted

## 2021-10-06 DIAGNOSIS — E119 Type 2 diabetes mellitus without complications: Secondary | ICD-10-CM

## 2021-10-06 DIAGNOSIS — E1165 Type 2 diabetes mellitus with hyperglycemia: Secondary | ICD-10-CM

## 2021-10-06 DIAGNOSIS — E785 Hyperlipidemia, unspecified: Secondary | ICD-10-CM

## 2021-10-06 MED ORDER — METFORMIN HCL 500 MG PO TABS: 1000.0000 mg | ORAL_TABLET | Freq: Two times a day (BID) | ORAL | 0 refills | Status: DC

## 2021-10-06 MED ORDER — ATORVASTATIN CALCIUM 40 MG PO TABS: 40.0000 mg | ORAL_TABLET | Freq: Every day | ORAL | 0 refills | Status: DC

## 2021-10-06 MED ORDER — PIOGLITAZONE HCL 30 MG PO TABS: 30.0000 mg | ORAL_TABLET | Freq: Every day | ORAL | 0 refills | Status: DC

## 2021-11-24 NOTE — Progress Notes (Signed)
I,[name],acting as a scribe for Denise Paganini, MD.,have documented all relevant documentation on the behalf of Denise Paganini, MD,as directed by  Denise Paganini, MD while in the presence of Denise Paganini, MD.   Established patient visit   Patient: Denise Torres   DOB: [birth year]   66 y.o. Female  MRN: [id number] Visit Date: 11/26/2021  Today's healthcare provider: [name], MD   Chief Complaint  Patient presents with   follow-up DM   Subjective    HPI  Diabetes Mellitus Type II, follow-up  Lab Results  Component Value Date   HGBA1C 7.5 (A) 11/26/2021   HGBA1C 7.1 (H) 06/12/2021   HGBA1C 6.9 (H) 03/06/2021   Last seen for diabetes 6 months ago.  Management since then includes continuing the same treatment. She reports excellent compliance with treatment. She is not having side effects.   Home blood sugar records:  not being checked  Episodes of hypoglycemia? No not being checked   Current insulin regiment: none Most Recent Eye Exam: UTD  --------------------------------------------------------------------------------------------------- Lipid/Cholesterol, follow-up  Last Lipid Panel: Lab Results  Component Value Date   CHOL 145 03/06/2021   LDLCALC 59 03/06/2021   HDL 75 03/06/2021   TRIG 53 03/06/2021    She was last seen for this 6 months ago.  Management since that visit includes Continue statin.  She reports excellent compliance with treatment. She is not having side effects.   Symptoms: No appetite changes No foot ulcerations  No chest pain No chest pressure/discomfort  No dyspnea No orthopnea  No fatigue No lower extremity edema  No palpitations No paroxysmal nocturnal dyspnea  No nausea No numbness or tingling of extremity  No polydipsia No polyuria  No speech difficulty No syncope   She is following a Regular diet. Current exercise:  stretching and squats  Last metabolic panel Lab Results  Component  Value Date   GLUCOSE 151 (H) 03/06/2021   NA 141 03/06/2021   K 4.1 03/06/2021   BUN 9 03/06/2021   CREATININE 0.80 03/06/2021   EGFR 83 03/06/2021   GFRNONAA 73 05/15/2020   CALCIUM 9.2 03/06/2021   AST 27 03/06/2021   ALT 24 03/06/2021   The 10-year ASCVD risk score (Arnett DK, et al., 2019) is: 10.6%   Fell in the middle of the night on Sunday on the way back to bed from the bathroom.  Bruising on the face after hitting R side of face on nightstand.  Seems to be doing better, but wants to make sure.  Doesn't feel like anything is broken. ---------------------------------------------------------------------------------------------------   Medications: Outpatient Medications Prior to Visit  Medication Sig   atorvastatin (LIPITOR) 40 MG tablet Take 1 tablet (40 mg total) by mouth daily.   Cholecalciferol 25 MCG (1000 UT) tablet Take 1 tablet by mouth daily.   metFORMIN (GLUCOPHAGE) 500 MG tablet Take 2 tablets (1,000 mg total) by mouth 2 (two) times daily.   pioglitazone (ACTOS) 30 MG tablet Take 1 tablet (30 mg total) by mouth daily.   Vitamin Mixture (VITAMIN E COMPLETE PO) Take 1 tablet by mouth daily.   No facility-administered medications prior to visit.    Review of Systems  Constitutional:  Negative for appetite change, chills, fatigue and fever.  Respiratory:  Negative for chest tightness and shortness of breath.   Cardiovascular:  Negative for chest pain and palpitations.  Gastrointestinal:  Negative for abdominal pain, nausea and vomiting.  Neurological:  Negative for dizziness and weakness.  Objective    BP 123/77 (BP Location: Left Arm, Patient Position: Sitting, Cuff Size: Normal)   Pulse 73   Temp 97.8 F (36.6 C) (Oral)   Resp 16   Ht $R'5\' 5"'fs$  (1.651 m)   Wt 145 lb 8 oz (66 kg)   BMI 24.21 kg/m    Physical Exam Vitals reviewed.  Constitutional:      General: She is not in acute distress.    Appearance: Normal appearance. She is well-developed.  She is not diaphoretic.  HENT:     Head: Normocephalic.     Comments: Bruising of face b/l with no TTP Eyes:     General: No scleral icterus.    Conjunctiva/sclera: Conjunctivae normal.  Neck:     Thyroid: No thyromegaly.  Cardiovascular:     Rate and Rhythm: Normal rate and regular rhythm.     Pulses: Normal pulses.     Heart sounds: Normal heart sounds. No murmur heard. Pulmonary:     Effort: Pulmonary effort is normal. No respiratory distress.     Breath sounds: Normal breath sounds. No wheezing, rhonchi or rales.  Musculoskeletal:     Cervical back: Neck supple.     Right lower leg: No edema.     Left lower leg: No edema.  Lymphadenopathy:     Cervical: No cervical adenopathy.  Skin:    General: Skin is warm and dry.     Findings: No rash.  Neurological:     Mental Status: She is alert and oriented to person, place, and time. Mental status is at baseline.  Psychiatric:        Mood and Affect: Mood normal.        Behavior: Behavior normal.       Diabetic Foot Exam - Simple   Simple Foot Form Diabetic Foot exam was performed with the following findings: Yes 11/26/2021  3:52 PM  Visual Inspection No deformities, no ulcerations, no other skin breakdown bilaterally: Yes Sensation Testing Intact to touch and monofilament testing bilaterally: Yes Pulse Check Posterior Tibialis and Dorsalis pulse intact bilaterally: Yes Comments      Results for orders placed or performed in visit on 11/26/21  POCT glycosylated hemoglobin (Hb A1C)  Result Value Ref Range   Hemoglobin A1C 7.5 (A) 4.0 - 5.6 %   Est. average glucose Bld gHb Est-mCnc 169     Assessment & Plan     Problem List Items Addressed This Visit       Endocrine   T2DM (type 2 diabetes mellitus) ([place]) - Primary    Chronic and uncontrolled Hyperglycemia On new insurance so will try to re-Rx jardiance that was previously cost prohibitive Continue metformin and actos If jardiance is unaffordable, consider  increasing actos dose Foot exam and uacr today F/u in 3 months      Relevant Medications   empagliflozin (JARDIANCE) 25 MG TABS tablet   Other Relevant Orders   POCT glycosylated hemoglobin (Hb A1C) (Completed)   Microalbumin / creatinine urine ratio   Hyperlipidemia associated with type 2 diabetes mellitus ([place])    Previously well controlled Continue statin Repeat FLP and CMP annually      Relevant Medications   empagliflozin (JARDIANCE) 25 MG TABS tablet     Other   Tobacco abuse    Discussion regarding the harms of tobacco use, the benefits of cessation, and methods of cessation Patient is currently precontemplative  Will reassess at next visit       Other Visit Diagnoses  Fall, initial encounter       Contusion of face, initial encounter          - recent fall - no evidence of any fractures - bruising is improving - continue conservative monitoring - given time frame, out of window for concern for intracranial pathology    Return in about 3 months (around 02/26/2022) for chronic disease f/u.      I, Denise Paganini, MD, have reviewed all documentation for this visit. The documentation on 11/26/21 for the exam, diagnosis, procedures, and orders are all accurate and complete.   Alexia Dinger, Dionne Bucy, MD, MPH [facility]

## 2021-11-25 NOTE — Patient Instructions (Signed)
The CDC recommends two doses of Shingrix (the shingles vaccine) separated by 2 to 6 months for adults age 64 years and older. 

## 2021-11-26 ENCOUNTER — Ambulatory Visit (INDEPENDENT_AMBULATORY_CARE_PROVIDER_SITE_OTHER): Payer: BC Managed Care – PPO | Admitting: Family Medicine

## 2021-11-26 ENCOUNTER — Encounter: Payer: Self-pay | Admitting: Family Medicine

## 2021-11-26 VITALS — BP 123/77 | HR 73 | Temp 97.8°F | Resp 16 | Ht 65.0 in | Wt 145.5 lb

## 2021-11-26 DIAGNOSIS — E1165 Type 2 diabetes mellitus with hyperglycemia: Secondary | ICD-10-CM

## 2021-11-26 DIAGNOSIS — E1169 Type 2 diabetes mellitus with other specified complication: Secondary | ICD-10-CM

## 2021-11-26 DIAGNOSIS — Z720 Tobacco use: Secondary | ICD-10-CM

## 2021-11-26 DIAGNOSIS — W19XXXA Unspecified fall, initial encounter: Secondary | ICD-10-CM | POA: Diagnosis not present

## 2021-11-26 DIAGNOSIS — E119 Type 2 diabetes mellitus without complications: Secondary | ICD-10-CM

## 2021-11-26 DIAGNOSIS — E785 Hyperlipidemia, unspecified: Secondary | ICD-10-CM

## 2021-11-26 DIAGNOSIS — S0083XA Contusion of other part of head, initial encounter: Secondary | ICD-10-CM

## 2021-11-26 LAB — POCT GLYCOSYLATED HEMOGLOBIN (HGB A1C)
Est. average glucose Bld gHb Est-mCnc: 169
Hemoglobin A1C: 7.5 % — AB (ref 4.0–5.6)

## 2021-11-26 MED ORDER — EMPAGLIFLOZIN 25 MG PO TABS: 25.0000 mg | ORAL_TABLET | Freq: Every day | ORAL | 2 refills | Status: DC

## 2021-11-26 NOTE — Assessment & Plan Note (Signed)
Chronic and uncontrolled Hyperglycemia On new insurance so will try to re-Rx jardiance that was previously cost prohibitive Continue metformin and actos If jardiance is unaffordable, consider increasing actos dose Foot exam and uacr today F/u in 3 months

## 2021-11-26 NOTE — Assessment & Plan Note (Signed)
Discussion regarding the harms of tobacco use, the benefits of cessation, and methods of cessation Patient is currently precontemplative  Will reassess at next visit  

## 2021-11-26 NOTE — Assessment & Plan Note (Signed)
Previously well controlled Continue statin Repeat FLP and CMP annually 

## 2021-12-07 ENCOUNTER — Other Ambulatory Visit
Admission: RE | Admit: 2021-12-07 | Discharge: 2021-12-07 | Disposition: A | Discharge status: Home / Self Care | Payer: BC Managed Care – PPO | Attending: Family Medicine | Admitting: Family Medicine

## 2021-12-07 DIAGNOSIS — E1165 Type 2 diabetes mellitus with hyperglycemia: Secondary | ICD-10-CM | POA: Diagnosis not present

## 2021-12-09 LAB — MICROALBUMIN / CREATININE URINE RATIO
Creatinine, Urine: 47.9 mg/dL
Microalb Creat Ratio: 15 mg/g creat (ref 0–29)
Microalb, Ur: 7 ug/mL — ABNORMAL HIGH

## 2022-01-18 ENCOUNTER — Other Ambulatory Visit: Payer: Self-pay | Admitting: Family Medicine

## 2022-01-18 DIAGNOSIS — E785 Hyperlipidemia, unspecified: Secondary | ICD-10-CM

## 2022-01-18 DIAGNOSIS — E1165 Type 2 diabetes mellitus with hyperglycemia: Secondary | ICD-10-CM

## 2022-03-01 ENCOUNTER — Ambulatory Visit (INDEPENDENT_AMBULATORY_CARE_PROVIDER_SITE_OTHER): Payer: BC Managed Care – PPO | Admitting: Family Medicine

## 2022-03-01 ENCOUNTER — Encounter: Payer: Self-pay | Admitting: Family Medicine

## 2022-03-01 ENCOUNTER — Telehealth: Payer: Self-pay | Admitting: Family Medicine

## 2022-03-01 VITALS — BP 125/74 | HR 69 | Temp 97.7°F | Resp 16 | Wt 141.0 lb

## 2022-03-01 DIAGNOSIS — E1165 Type 2 diabetes mellitus with hyperglycemia: Secondary | ICD-10-CM | POA: Diagnosis not present

## 2022-03-01 DIAGNOSIS — E785 Hyperlipidemia, unspecified: Secondary | ICD-10-CM | POA: Diagnosis not present

## 2022-03-01 DIAGNOSIS — Z23 Encounter for immunization: Secondary | ICD-10-CM

## 2022-03-01 MED ORDER — EMPAGLIFLOZIN 25 MG PO TABS: 25.0000 mg | ORAL_TABLET | Freq: Every day | ORAL | 2 refills | Status: DC

## 2022-03-01 MED ORDER — PIOGLITAZONE HCL 30 MG PO TABS: 30.0000 mg | ORAL_TABLET | Freq: Every day | ORAL | 1 refills | Status: DC

## 2022-03-01 MED ORDER — METFORMIN HCL 500 MG PO TABS: 1000.0000 mg | ORAL_TABLET | Freq: Two times a day (BID) | ORAL | 1 refills | Status: DC

## 2022-03-01 MED ORDER — ATORVASTATIN CALCIUM 40 MG PO TABS: 40.0000 mg | ORAL_TABLET | Freq: Every day | ORAL | 0 refills | Status: DC

## 2022-03-01 MED ORDER — METFORMIN HCL 500 MG PO TABS: 1000.0000 mg | ORAL_TABLET | Freq: Two times a day (BID) | ORAL | 0 refills | Status: DC

## 2022-03-01 MED ORDER — PIOGLITAZONE HCL 30 MG PO TABS: 30.0000 mg | ORAL_TABLET | Freq: Every day | ORAL | 0 refills | Status: DC

## 2022-03-01 MED ORDER — ATORVASTATIN CALCIUM 40 MG PO TABS: 40.0000 mg | ORAL_TABLET | Freq: Every day | ORAL | 1 refills | Status: DC

## 2022-03-01 NOTE — Telephone Encounter (Signed)
[facility] faxed refill request for the following medications:  empagliflozin (JARDIANCE) 25 MG TABS tablet   Please advise.

## 2022-03-01 NOTE — Assessment & Plan Note (Signed)
CBGs at goal with addition of jardiance. Able to afford through [facility], refills sent. Obtaining A1c and BMP today. F/u in 6 months.

## 2022-03-01 NOTE — Progress Notes (Signed)
    SUBJECTIVE:   CHIEF COMPLAINT / HPI:   Diabetes, Type 2 - Last A1c 7.5 12/2021 - Medications: metformin, jardiance (started at last visit), actos - Compliance: good - tolerating jardiance well. Able to afford through [facility]. - Checking BG at home: yes, 100-123. - Eye exam: UTD - Foot exam: UTD - Microalbumin: UTD - Statin: yes - Denies symptoms of hypoglycemia, polyuria, polydipsia, numbness extremities, foot ulcers/trauma   OBJECTIVE:   BP 125/74 (BP Location: Left Arm, Patient Position: Sitting, Cuff Size: Normal)   Pulse 69   Temp 97.7 F (36.5 C) (Oral)   Resp 16   Wt 141 lb (64 kg)   BMI 23.46 kg/m   Gen: well appearing, in NAD Card: RRR Lungs: CTAB Ext: WWP, no edema   ASSESSMENT/PLAN:   T2DM (type 2 diabetes mellitus) (H[facility]) CBGs at goal with addition of jardiance. Able to afford through [facility], refills sent. Obtaining A1c and BMP today. F/u in 6 months.     Myles Gip, DO

## 2022-03-02 LAB — BASIC METABOLIC PANEL
BUN/Creatinine Ratio: 12 (ref 12–28)
BUN: 10 mg/dL (ref 8–27)
CO2: 24 mmol/L (ref 20–29)
Calcium: 9 mg/dL (ref 8.7–10.3)
Chloride: 102 mmol/L (ref 96–106)
Creatinine, Ser: 0.83 mg/dL (ref 0.57–1.00)
Glucose: 127 mg/dL — ABNORMAL HIGH (ref 70–99)
Potassium: 3.8 mmol/L (ref 3.5–5.2)
Sodium: 142 mmol/L (ref 134–144)
eGFR: 79 mL/min/{1.73_m2} (ref >59)

## 2022-03-02 LAB — HEMOGLOBIN A1C
Est. average glucose Bld gHb Est-mCnc: 148 mg/dL
Hgb A1c MFr Bld: 6.8 % — ABNORMAL HIGH (ref 4.8–5.6)

## 2022-03-08 LAB — HM DIABETES EYE EXAM

## 2022-08-02 ENCOUNTER — Encounter: Payer: Self-pay | Admitting: Family Medicine

## 2022-08-02 DIAGNOSIS — E785 Hyperlipidemia, unspecified: Secondary | ICD-10-CM

## 2022-08-02 DIAGNOSIS — E1165 Type 2 diabetes mellitus with hyperglycemia: Secondary | ICD-10-CM

## 2022-08-03 MED ORDER — PIOGLITAZONE HCL 30 MG PO TABS: 30.0000 mg | ORAL_TABLET | Freq: Every day | ORAL | 1 refills | Status: DC

## 2022-08-03 MED ORDER — ATORVASTATIN CALCIUM 40 MG PO TABS: 40.0000 mg | ORAL_TABLET | Freq: Every day | ORAL | 1 refills | Status: DC

## 2022-08-03 MED ORDER — METFORMIN HCL 500 MG PO TABS: 1000.0000 mg | ORAL_TABLET | Freq: Two times a day (BID) | ORAL | 1 refills | Status: DC

## 2022-08-30 ENCOUNTER — Ambulatory Visit: Payer: BC Managed Care – PPO | Admitting: Family Medicine

## 2022-09-13 NOTE — Progress Notes (Unsigned)
I,Greysen Devino S Gracious Renken,acting as a Education administrator for [name], MD.,have documented all relevant documentation on the behalf of [name], MD,as directed by  [name], MD while in the presence of [name], MD.    Established patient visit   Patient: Denise Torres   DOB: 1955-08-07   67 y.o. Female  MRN: [id number] Visit Date: 09/14/2022  Today's healthcare provider: [name], MD   Chief Complaint  Patient presents with   Diabetes   Subjective    HPI  Diabetes Mellitus Type II, follow-up  Lab Results  Component Value Date   HGBA1C 7.8 (A) 09/14/2022   HGBA1C 6.8 (H) 03/01/2022   HGBA1C 7.5 (A) 11/26/2021   Last seen for diabetes 6 months ago.  Management since then includes continuing the same treatment. She reports fair compliance with treatment. Has been with out Jardiance 25 mg for about 3 months due to cost.  She is not having side effects.   Home blood sugar records: fasting range: 130s  Episodes of hypoglycemia? No    Current insulin regiment: none Most Recent Eye Exam: UTD  --------------------------------------------------------------------------------------------------- Lipid/Cholesterol, follow-up  Last Lipid Panel: Lab Results  Component Value Date   CHOL 145 03/06/2021   LDLCALC 59 03/06/2021   HDL 75 03/06/2021   TRIG 53 03/06/2021    She was last seen for this 6 months ago.  Management since that visit includes no changes.  She reports excellent compliance with treatment. She is not having side effects.   Last metabolic panel Lab Results  Component Value Date   GLUCOSE 127 (H) 03/01/2022   NA 142 03/01/2022   K 3.8 03/01/2022   BUN 10 03/01/2022   CREATININE 0.83 03/01/2022   EGFR 79 03/01/2022   GFRNONAA 73 05/15/2020   CALCIUM 9.0 03/01/2022   AST 27 03/06/2021   ALT 24 03/06/2021   The 10-year ASCVD risk score (Arnett DK, et al., 2019) is:  9.9%  ---------------------------------------------------------------------------------------------------   Medications: Outpatient Medications Prior to Visit  Medication Sig   atorvastatin (LIPITOR) 40 MG tablet Take 1 tablet (40 mg total) by mouth daily.   Cholecalciferol 25 MCG (1000 UT) tablet Take 1 tablet by mouth daily.   metFORMIN (GLUCOPHAGE) 500 MG tablet Take 2 tablets (1,000 mg total) by mouth 2 (two) times daily.   pioglitazone (ACTOS) 30 MG tablet Take 1 tablet (30 mg total) by mouth daily.   Vitamin Mixture (VITAMIN E COMPLETE PO) Take 1 tablet by mouth daily.   empagliflozin (JARDIANCE) 25 MG TABS tablet Take 1 tablet (25 mg total) by mouth daily before breakfast. (Patient not taking: Reported on 09/14/2022)   No facility-administered medications prior to visit.    Review of Systems  Constitutional:  Negative for appetite change.  Eyes:  Negative for visual disturbance.  Respiratory:  Negative for cough, chest tightness and shortness of breath.   Cardiovascular:  Negative for chest pain, palpitations and leg swelling.  Gastrointestinal:  Negative for abdominal pain, diarrhea, nausea and vomiting.       Objective    BP 112/72 (BP Location: Left Arm, Patient Position: Sitting, Cuff Size: Large)   Pulse 67   Temp 97.6 F (36.4 C) (Temporal)   Resp 16   Ht 5\' 5"  (1.651 m)   Wt 140 lb (63.5 kg)   BMI 23.30 kg/m    Physical Exam Vitals reviewed.  Constitutional:      General: She is not in acute distress.    Appearance: Normal appearance. She is  well-developed. She is not diaphoretic.  HENT:     Head: Normocephalic and atraumatic.  Eyes:     General: No scleral icterus.    Conjunctiva/sclera: Conjunctivae normal.  Neck:     Thyroid: No thyromegaly.  Cardiovascular:     Rate and Rhythm: Normal rate and regular rhythm.     Heart sounds: Normal heart sounds. No murmur heard. Pulmonary:     Effort: Pulmonary effort is normal. No respiratory distress.      Breath sounds: Normal breath sounds. No wheezing, rhonchi or rales.  Musculoskeletal:     Cervical back: Neck supple.     Right lower leg: No edema.     Left lower leg: No edema.  Lymphadenopathy:     Cervical: No cervical adenopathy.  Skin:    General: Skin is warm and dry.     Findings: No rash.  Neurological:     Mental Status: She is alert and oriented to person, place, and time. Mental status is at baseline.  Psychiatric:        Mood and Affect: Mood normal.        Behavior: Behavior normal.       Results for orders placed or performed in visit on 09/14/22  POCT glycosylated hemoglobin (Hb A1C)  Result Value Ref Range   Hemoglobin A1C 7.8 (A) 4.0 - 5.6 %   Est. average glucose Bld gHb Est-mCnc 177     Assessment & Plan     Problem List Items Addressed This Visit       Endocrine   T2DM (type 2 diabetes mellitus) - Primary    Uncontrolled with hyperglycemia on today's A1c Issues getting Jardiance - due to pharmacy issues - working on getting new savings card - will give samples today Continue current medications - will try to resume Jardiance UTD on vaccines, eye exam, foot exam, UACR On Statin Discussed diet and exercise F/u in 3 months       Relevant Orders   POCT glycosylated hemoglobin (Hb A1C) (Completed)   Hyperlipidemia associated with type 2 diabetes mellitus    Previously well controlled Continue statin Repeat FLP and CMP Goal LDL < 70        Return in about 3 months (around 12/14/2022) for CPE.      I, [name], MD, have reviewed all documentation for this visit. The documentation on 09/14/22 for the exam, diagnosis, procedures, and orders are all accurate and complete.   Bacigalupo, Dionne Bucy, MD, MPH [facility]

## 2022-09-14 ENCOUNTER — Ambulatory Visit (INDEPENDENT_AMBULATORY_CARE_PROVIDER_SITE_OTHER): Payer: BC Managed Care – PPO | Admitting: Family Medicine

## 2022-09-14 VITALS — BP 112/72 | HR 67 | Temp 97.6°F | Resp 16 | Ht 65.0 in | Wt 140.0 lb

## 2022-09-14 DIAGNOSIS — E1165 Type 2 diabetes mellitus with hyperglycemia: Secondary | ICD-10-CM | POA: Diagnosis not present

## 2022-09-14 DIAGNOSIS — E785 Hyperlipidemia, unspecified: Secondary | ICD-10-CM | POA: Diagnosis not present

## 2022-09-14 DIAGNOSIS — E1169 Type 2 diabetes mellitus with other specified complication: Secondary | ICD-10-CM

## 2022-09-14 LAB — POCT GLYCOSYLATED HEMOGLOBIN (HGB A1C)
Est. average glucose Bld gHb Est-mCnc: 177
Hemoglobin A1C: 7.8 % — AB (ref 4.0–5.6)

## 2022-09-14 NOTE — Assessment & Plan Note (Signed)
Previously well controlled Continue statin Repeat FLP and CMP Goal LDL < 70 

## 2022-09-14 NOTE — Assessment & Plan Note (Addendum)
Uncontrolled with hyperglycemia on today's A1c Issues getting Jardiance - due to pharmacy issues - working on getting new savings card - will give samples today Continue current medications - will try to resume Jardiance UTD on vaccines, eye exam, foot exam, UACR On Statin Discussed diet and exercise F/u in 3 months

## 2022-09-15 LAB — LIPID PANEL
Chol/HDL Ratio: 2.2 ratio (ref 0.0–4.4)
Cholesterol, Total: 119 mg/dL (ref 100–199)
HDL: 54 mg/dL (ref >39)
LDL Chol Calc (NIH): 49 mg/dL (ref 0–99)
Triglycerides: 79 mg/dL (ref 0–149)
VLDL Cholesterol Cal: 16 mg/dL (ref 5–40)

## 2022-09-15 LAB — COMPREHENSIVE METABOLIC PANEL
ALT: 143 IU/L — ABNORMAL HIGH (ref 0–32)
AST: 123 IU/L — ABNORMAL HIGH (ref 0–40)
Albumin/Globulin Ratio: 1.6 (ref 1.2–2.2)
Albumin: 4.3 g/dL (ref 3.9–4.9)
Alkaline Phosphatase: 99 IU/L (ref 44–121)
BUN/Creatinine Ratio: 11 — ABNORMAL LOW (ref 12–28)
BUN: 8 mg/dL (ref 8–27)
Bilirubin Total: 0.5 mg/dL (ref 0.0–1.2)
CO2: 22 mmol/L (ref 20–29)
Calcium: 9 mg/dL (ref 8.7–10.3)
Chloride: 102 mmol/L (ref 96–106)
Creatinine, Ser: 0.74 mg/dL (ref 0.57–1.00)
Globulin, Total: 2.7 g/dL (ref 1.5–4.5)
Glucose: 165 mg/dL — ABNORMAL HIGH (ref 70–99)
Potassium: 3.9 mmol/L (ref 3.5–5.2)
Sodium: 139 mmol/L (ref 134–144)
Total Protein: 7 g/dL (ref 6.0–8.5)
eGFR: 90 mL/min/{1.73_m2} (ref >59)

## 2022-09-16 ENCOUNTER — Telehealth: Payer: Self-pay

## 2022-09-16 DIAGNOSIS — R7989 Other specified abnormal findings of blood chemistry: Secondary | ICD-10-CM

## 2022-09-16 NOTE — Telephone Encounter (Signed)
-----   Message from Virginia Crews, MD sent at 09/16/2022  1:03 PM EDT ----- Normal/stable labs, except elevation of liver function tests.  Recommend cutting back on tylenol and alcohol and hydrate well.  Recheck LFTs in 1 month.

## 2022-09-28 ENCOUNTER — Encounter: Payer: Self-pay | Admitting: Family Medicine

## 2022-09-28 MED ORDER — EMPAGLIFLOZIN 25 MG PO TABS: 25.0000 mg | ORAL_TABLET | Freq: Every day | ORAL | 2 refills | Status: DC

## 2022-12-05 ENCOUNTER — Other Ambulatory Visit: Payer: Self-pay | Admitting: Family Medicine

## 2022-12-05 DIAGNOSIS — E1165 Type 2 diabetes mellitus with hyperglycemia: Secondary | ICD-10-CM

## 2022-12-05 DIAGNOSIS — E785 Hyperlipidemia, unspecified: Secondary | ICD-10-CM

## 2022-12-07 NOTE — Telephone Encounter (Signed)
Requested Prescriptions  Refused Prescriptions Disp Refills   atorvastatin (LIPITOR) 40 MG tablet [Pharmacy Med Name: ATORVASTATIN TAB 40MG ] 90 tablet 1    Sig: TAKE 1 TABLET DAILY     Cardiovascular:  Antilipid - Statins Failed - 12/05/2022 12:48 PM      Failed - Lipid Panel in normal range within the last 12 months    Cholesterol, Total  Date Value Ref Range Status  09/14/2022 119 100 - 199 mg/dL Final   LDL Cholesterol (Calc)  Date Value Ref Range Status  03/23/2017 133 (H) mg/dL (calc) Final    Comment:    Reference range: <100 . Desirable range <100 mg/dL for primary prevention;   <70 mg/dL for patients with CHD or diabetic patients  with > or = 2 CHD risk factors. Marland Kitchen LDL-C is now calculated using the Martin-Hopkins  calculation, which is a validated novel method providing  better accuracy than the Friedewald equation in the  estimation of LDL-C.  Horald Pollen et al. Lenox Ahr. 4098;119(14): 2061-2068  (http://education.[employer].com/faq/FAQ164)    LDL Chol Calc (NIH)  Date Value Ref Range Status  09/14/2022 49 0 - 99 mg/dL Final   HDL  Date Value Ref Range Status  09/14/2022 54 >39 mg/dL Final   Triglycerides  Date Value Ref Range Status  09/14/2022 79 0 - 149 mg/dL Final         Passed - Patient is not pregnant      Passed - Valid encounter within last 12 months    Recent Outpatient Visits           2 months ago Type 2 diabetes mellitus with hyperglycemia, without long-term current use of insulin (HCC)   [place] [facility] [place], Marzella Schlein, MD   9 months ago Type 2 diabetes mellitus with hyperglycemia, without long-term current use of insulin (HCC)   [place] [facility] [place], Darl Householder, DO   1 year ago Type 2 diabetes mellitus with hyperglycemia, without long-term current use of insulin (HCC)   [place] [facility] [place], Marzella Schlein, MD   1 year ago COVID-19   [facility] Limes, MD   1 year ago Type 2 diabetes mellitus without complication, without long-term current use of insulin (HCC)   [place] [facility] [place], Marzella Schlein, MD               metFORMIN (GLUCOPHAGE) 500 MG tablet [Pharmacy Med Name: METFORMIN TAB 500MG ] 360 tablet 1    Sig: TAKE 2 TABLETS (1000MG      TOTAL) TWO TIMES A DAY     Endocrinology:  Diabetes - Biguanides Failed - 12/05/2022 12:48 PM      Failed - B12 Level in normal range and within 720 days    No results found for: "VITAMINB12"       Failed - CBC within normal limits and completed in the last 12 months    WBC  Date Value Ref Range Status  03/23/2017 5.3 3.8 - 10.8 Thousand/uL Final   RBC  Date Value Ref Range Status  03/23/2017 4.43 3.80 - 5.10 Million/uL Final   Hemoglobin  Date Value Ref Range Status  03/23/2017 14.0 11.7 - 15.5 g/dL Final   HCT  Date Value Ref Range Status  03/23/2017 39.9 35.0 - 45.0 % Final   MCHC  Date Value Ref Range Status  03/23/2017 35.1 32.0 - 36.0 g/dL Final   [facility]  Date Value Ref  Range Status  03/23/2017 31.6 27.0 - 33.0 pg Final   MCV  Date Value Ref Range Status  03/23/2017 90.1 80.0 - 100.0 fL Final   No results found for: "PLTCOUNTKUC", "LABPLAT", "POCPLA" RDW  Date Value Ref Range Status  03/23/2017 12.1 11.0 - 15.0 % Final         Passed - Cr in normal range and within 360 days    Creat  Date Value Ref Range Status  03/23/2017 0.90 0.50 - 1.05 mg/dL Final    Comment:    For patients >3 years of age, the reference limit for Creatinine is approximately 13% higher for people identified as African-American. .    Creatinine, Ser  Date Value Ref Range Status  09/14/2022 0.74 0.57 - 1.00 mg/dL Final   Creatinine, POC  Date Value Ref Range Status  04/19/2018 NA mg/dL Final         Passed - HBA1C is between 0 and 7.9 and within 180 days    Hemoglobin A1C  Date Value Ref Range Status  09/14/2022 7.8 (A) 4.0 - 5.6 %  Final   Hgb A1c MFr Bld  Date Value Ref Range Status  03/01/2022 6.8 (H) 4.8 - 5.6 % Final    Comment:             Prediabetes: 5.7 - 6.4          Diabetes: >6.4          Glycemic control for adults with diabetes: <7.0          Passed - eGFR in normal range and within 360 days    GFR, Est African American  Date Value Ref Range Status  03/23/2017 81 > OR = 60 mL/min/1.107m2 Final   GFR calc Af Amer  Date Value Ref Range Status  05/15/2020 84 >59 mL/min/1.73 Final    Comment:    **In accordance with recommendations from the NKF-ASN Task force,**   [facility] is in the process of updating its eGFR calculation to the   2021 CKD-EPI creatinine equation that estimates kidney function   without a race variable.    GFR, Est Non African American  Date Value Ref Range Status  03/23/2017 70 > OR = 60 mL/min/1.30m2 Final   GFR calc non Af Amer  Date Value Ref Range Status  05/15/2020 73 >59 mL/min/1.73 Final   eGFR  Date Value Ref Range Status  09/14/2022 90 >59 mL/min/1.73 Final         Passed - Valid encounter within last 6 months    Recent Outpatient Visits           2 months ago Type 2 diabetes mellitus with hyperglycemia, without long-term current use of insulin (HCC)   [place] [facility] [place], Marzella Schlein, MD   9 months ago Type 2 diabetes mellitus with hyperglycemia, without long-term current use of insulin [facility])   [place] [facility] [place], Jill Side M, DO   1 year ago Type 2 diabetes mellitus with hyperglycemia, without long-term current use of insulin [facility])   [place] [facility] [place], Marzella Schlein, MD   1 year ago COVID-19   [facility] Malva Limes, MD   1 year ago Type 2 diabetes mellitus without complication, without long-term current use of insulin [facility])    [facility] [place], Marzella Schlein, MD               pioglitazone (ACTOS) 30  MG tablet  [Pharmacy Med Name: PIOGLITAZONE TAB 30MG ] 90 tablet 1    Sig: TAKE 1 TABLET DAILY     Endocrinology:  Diabetes - Glitazones - pioglitazone Passed - 12/05/2022 12:48 PM      Passed - HBA1C is between 0 and 7.9 and within 180 days    Hemoglobin A1C  Date Value Ref Range Status  09/14/2022 7.8 (A) 4.0 - 5.6 % Final   Hgb A1c MFr Bld  Date Value Ref Range Status  03/01/2022 6.8 (H) 4.8 - 5.6 % Final    Comment:             Prediabetes: 5.7 - 6.4          Diabetes: >6.4          Glycemic control for adults with diabetes: <7.0          Passed - Valid encounter within last 6 months    Recent Outpatient Visits           2 months ago Type 2 diabetes mellitus with hyperglycemia, without long-term current use of insulin (HCC)   [place] [facility] [place], Marzella Schlein, MD   9 months ago Type 2 diabetes mellitus with hyperglycemia, without long-term current use of insulin [facility])   [place] [facility] [place], Jill Side M, DO   1 year ago Type 2 diabetes mellitus with hyperglycemia, without long-term current use of insulin [facility])    [facility] [place], Marzella Schlein, MD   1 year ago COVID-19   [facility] Malva Limes, MD   1 year ago Type 2 diabetes mellitus without complication, without long-term current use of insulin [facility])    [facility] [place], Marzella Schlein, MD

## 2022-12-17 ENCOUNTER — Encounter: Payer: Self-pay | Admitting: Family Medicine

## 2022-12-17 DIAGNOSIS — E1165 Type 2 diabetes mellitus with hyperglycemia: Secondary | ICD-10-CM

## 2022-12-17 MED ORDER — PIOGLITAZONE HCL 30 MG PO TABS: 30.0000 mg | ORAL_TABLET | Freq: Every day | ORAL | 0 refills | Status: DC

## 2022-12-17 MED ORDER — METFORMIN HCL 500 MG PO TABS: 1000.0000 mg | ORAL_TABLET | Freq: Two times a day (BID) | ORAL | 0 refills | Status: DC

## 2022-12-17 MED ORDER — EMPAGLIFLOZIN 25 MG PO TABS: 25.0000 mg | ORAL_TABLET | Freq: Every day | ORAL | 0 refills | Status: DC

## 2023-01-17 ENCOUNTER — Other Ambulatory Visit: Payer: Self-pay | Admitting: Family Medicine

## 2023-01-17 DIAGNOSIS — E785 Hyperlipidemia, unspecified: Secondary | ICD-10-CM

## 2023-02-17 ENCOUNTER — Telehealth: Payer: Self-pay | Admitting: Family Medicine

## 2023-02-17 NOTE — Telephone Encounter (Signed)
[facility] faxed refill request for the following medications:   metFORMIN (GLUCOPHAGE) 500 MG tablet   atorvastatin (LIPITOR) 40 MG tablet   pioglitazone (ACTOS) 30 MG tablet    empagliflozin (JARDIANCE) 25 MG TABS tablet   Please advise.

## 2023-02-18 NOTE — Telephone Encounter (Signed)
Lmtcb to schedule an appt.  

## 2023-02-22 ENCOUNTER — Encounter: Payer: Self-pay | Admitting: Family Medicine

## 2023-02-22 ENCOUNTER — Telehealth (INDEPENDENT_AMBULATORY_CARE_PROVIDER_SITE_OTHER): Payer: BC Managed Care – PPO | Admitting: Family Medicine

## 2023-02-22 DIAGNOSIS — E1169 Type 2 diabetes mellitus with other specified complication: Secondary | ICD-10-CM | POA: Diagnosis not present

## 2023-02-22 DIAGNOSIS — E1165 Type 2 diabetes mellitus with hyperglycemia: Secondary | ICD-10-CM

## 2023-02-22 DIAGNOSIS — R7989 Other specified abnormal findings of blood chemistry: Secondary | ICD-10-CM

## 2023-02-22 DIAGNOSIS — Z7984 Long term (current) use of oral hypoglycemic drugs: Secondary | ICD-10-CM

## 2023-02-22 DIAGNOSIS — E785 Hyperlipidemia, unspecified: Secondary | ICD-10-CM

## 2023-02-22 MED ORDER — METFORMIN HCL 500 MG PO TABS: 1000.0000 mg | ORAL_TABLET | Freq: Two times a day (BID) | ORAL | 3 refills | Status: DC

## 2023-02-22 MED ORDER — ATORVASTATIN CALCIUM 40 MG PO TABS: 40.0000 mg | ORAL_TABLET | Freq: Every day | ORAL | 3 refills | Status: DC

## 2023-02-22 MED ORDER — EMPAGLIFLOZIN 25 MG PO TABS
25.0000 mg | ORAL_TABLET | Freq: Every day | ORAL | 3 refills | Status: DC
Start: 2023-02-22 — End: 2024-01-30

## 2023-02-22 MED ORDER — PIOGLITAZONE HCL 30 MG PO TABS: 30.0000 mg | ORAL_TABLET | Freq: Every day | ORAL | 3 refills | Status: DC

## 2023-02-22 NOTE — Assessment & Plan Note (Signed)
Patient is currently on Atorvastatin 40mg  daily. Last cholesterol panel was within normal limits. -Continue Atorvastatin 40mg  daily. -Plan to recheck cholesterol panel annually.

## 2023-02-22 NOTE — Progress Notes (Signed)
MyChart Video Visit    Virtual Visit via Video Note   This format is felt to be most appropriate for this patient at this time. Physical exam was limited by quality of the video and audio technology used for the visit.    Patient location: home Provider location: [facility] Persons involved in the visit: patient, provider   I discussed the limitations of evaluation and management by telemedicine and the availability of in person appointments. The patient expressed understanding and agreed to proceed.  Patient: Denise Torres   DOB: 1955/12/09   67 y.o. Female  MRN: [id number] Visit Date: 02/22/2023  Today's healthcare provider: Shirlee Latch, MD   Chief Complaint  Patient presents with   Medication Management   Subjective    HPI  Discussed the use of AI scribe software for clinical note transcription with the patient, who gave verbal consent to proceed.  History of Present Illness   The patient, with a history of diabetes, presents for a routine follow-up and prescription refills. They report a significant decrease in their blood sugar levels and weight loss after starting a new supplement they found on the internet. The supplement, called [facility] by [facility], is reported to have resulted in a 10-point drop in their blood sugar levels and a weight loss of 10 pounds within the first week of use. The patient reports no adverse effects from the supplement and plans to continue its use. They also report regular bowel movements, which they attribute to their current diabetes medications, Jardiance and Metformin. The patient is also transitioning to Medicare and has set up a new prescription plan with [facility] for mail order 90-day supplies. They are due for a "Welcome to Medicare" visit and are also due for their annual urine test for diabetes.        Medications: Outpatient Medications Prior to Visit  Medication Sig   Cholecalciferol 25 MCG (1000  UT) tablet Take 1 tablet by mouth daily.   Vitamin Mixture (VITAMIN E COMPLETE PO) Take 1 tablet by mouth daily.   [DISCONTINUED] atorvastatin (LIPITOR) 40 MG tablet TAKE 1 TABLET DAILY   [DISCONTINUED] empagliflozin (JARDIANCE) 25 MG TABS tablet Take 1 tablet (25 mg total) by mouth daily before breakfast.   [DISCONTINUED] metFORMIN (GLUCOPHAGE) 500 MG tablet Take 2 tablets (1,000 mg total) by mouth 2 (two) times daily.   [DISCONTINUED] pioglitazone (ACTOS) 30 MG tablet Take 1 tablet (30 mg total) by mouth daily.   No facility-administered medications prior to visit.    Review of Systems      Objective    There were no vitals taken for this visit.      Physical Exam Constitutional:      General: She is not in acute distress.    Appearance: Normal appearance.  HENT:     Head: Normocephalic.  Pulmonary:     Effort: Pulmonary effort is normal. No respiratory distress.  Neurological:     Mental Status: She is alert and oriented to person, place, and time. Mental status is at baseline.        Assessment & Plan     Problem List Items Addressed This Visit       Endocrine   T2DM (type 2 diabetes mellitus) (HCC) - Primary    Patient reports improved blood glucose control with the addition of a dietary supplement (Dr. Orson Gear [employer]). Average blood glucose readings over the past 90 days are 116 mg/dL, indicating good control. Last  A1c was 7.8. Patient is currently on Jardiance 25mg  daily, Metformin 1000mg  twice daily, and Actos 30mg  daily. -Order A1c, liver function tests, and urine microalbumin to creatinine ratio. -Consider reducing Actos to 15mg  daily depending on A1c results. -Continue Jardiance 25mg  daily, Metformin 1000mg  twice daily, and Actos 30mg  daily until labs return. - upcoming eye exam      Relevant Medications   atorvastatin (LIPITOR) 40 MG tablet   empagliflozin (JARDIANCE) 25 MG TABS tablet   metFORMIN (GLUCOPHAGE) 500 MG tablet   pioglitazone  (ACTOS) 30 MG tablet   Other Relevant Orders   Hemoglobin A1c   Urine Microalbumin w/creat. ratio   Hyperlipidemia associated with type 2 diabetes mellitus (HCC)    Patient is currently on Atorvastatin 40mg  daily. Last cholesterol panel was within normal limits. -Continue Atorvastatin 40mg  daily. -Plan to recheck cholesterol panel annually.      Relevant Medications   atorvastatin (LIPITOR) 40 MG tablet   empagliflozin (JARDIANCE) 25 MG TABS tablet   metFORMIN (GLUCOPHAGE) 500 MG tablet   pioglitazone (ACTOS) 30 MG tablet   Other Visit Diagnoses     Hyperlipidemia, unspecified hyperlipidemia type       Relevant Medications   atorvastatin (LIPITOR) 40 MG tablet   Elevated LFTs       Relevant Orders   Comprehensive metabolic panel           Medication Management Patient reports issues with prescription renewals and has recently switched to Medicare with a new prescription plan ([facility] for 90-day mail order). -Send prescriptions for Atorvastatin 40mg  daily, Jardiance 25mg  daily, Metformin 1000mg  twice daily, and Actos 30mg  daily to [facility] with 90-day supplies and refills.  General Health Maintenance / Followup Plans -Schedule "Welcome to Medicare" visit in approximately three months. -Follow up after lab results return to discuss potential adjustment of Actos dosage.        Return in about 3 months (around 05/24/2023) for welcome to medicare visit.     I discussed the assessment and treatment plan with the patient. The patient was provided an opportunity to ask questions and all were answered. The patient agreed with the plan and demonstrated an understanding of the instructions.   The patient was advised to call back or seek an in-person evaluation if the symptoms worsen or if the condition fails to improve as anticipated.   Shirlee Latch, MD [facility] [phone] (phone) [phone] (fax)  [facility]

## 2023-02-22 NOTE — Assessment & Plan Note (Signed)
Patient reports improved blood glucose control with the addition of a dietary supplement (Dr. Orson Gear [employer]). Average blood glucose readings over the past 90 days are 116 mg/dL, indicating good control. Last A1c was 7.8. Patient is currently on Jardiance 25mg  daily, Metformin 1000mg  twice daily, and Actos 30mg  daily. -Order A1c, liver function tests, and urine microalbumin to creatinine ratio. -Consider reducing Actos to 15mg  daily depending on A1c results. -Continue Jardiance 25mg  daily, Metformin 1000mg  twice daily, and Actos 30mg  daily until labs return. - upcoming eye exam

## 2023-04-06 LAB — HM DIABETES EYE EXAM

## 2023-04-07 ENCOUNTER — Encounter: Payer: Self-pay | Admitting: Family Medicine

## 2023-05-26 ENCOUNTER — Ambulatory Visit: Payer: Self-pay | Admitting: Family Medicine

## 2023-06-20 ENCOUNTER — Encounter: Payer: Medicare Other | Admitting: Family Medicine

## 2023-06-23 ENCOUNTER — Ambulatory Visit (INDEPENDENT_AMBULATORY_CARE_PROVIDER_SITE_OTHER): Payer: Medicare Other | Admitting: Family Medicine

## 2023-06-23 ENCOUNTER — Encounter: Payer: Self-pay | Admitting: Family Medicine

## 2023-06-23 VITALS — BP 134/66 | HR 73 | Ht 65.0 in | Wt 140.1 lb

## 2023-06-23 DIAGNOSIS — Z1231 Encounter for screening mammogram for malignant neoplasm of breast: Secondary | ICD-10-CM

## 2023-06-23 DIAGNOSIS — Z23 Encounter for immunization: Secondary | ICD-10-CM

## 2023-06-23 DIAGNOSIS — Z780 Asymptomatic menopausal state: Secondary | ICD-10-CM | POA: Diagnosis not present

## 2023-06-23 DIAGNOSIS — Z1211 Encounter for screening for malignant neoplasm of colon: Secondary | ICD-10-CM

## 2023-06-23 DIAGNOSIS — Z0000 Encounter for general adult medical examination without abnormal findings: Secondary | ICD-10-CM | POA: Diagnosis not present

## 2023-06-23 NOTE — Addendum Note (Signed)
 Addended by: Lily Kocher on: 06/23/2023 11:20 AM   Modules accepted: Orders

## 2023-06-23 NOTE — Progress Notes (Signed)
 Medicare Initial Preventative Physical Exam    Patient: Denise Torres, Female    DOB: September 17, 1955, 68 y.o.   MRN: [id number] Visit Date: 06/23/2023  Today's Provider: Jon Eva, MD   Chief Complaint  Patient presents with   Annual Exam   Subjective    Medicare Initial Preventative Physical Exam Denise Torres is a 68 y.o. female who presents today for her Initial Preventative Physical Exam.   Discussed the use of AI scribe software for clinical note transcription with the patient, who gave verbal consent to proceed.  History of Present Illness   The patient, with a history of diabetes, presents for a routine check-up. She reports ongoing stress related to home renovations and caring for her elderly father. The patient also mentions work-related stress.  Despite these stressors, the patient does not report any new or worsening symptoms related to her diabetes. She mentions that she has not had any issues with her feet, a common complication of diabetes. The patient also discusses the need for various screenings and vaccinations, including a colonoscopy, mammogram, bone density scan, and pneumonia and flu vaccines.         Social History   Socioeconomic History   Marital status: Married    Spouse name: Lamar   Number of children: 2   Years of education: 16   Highest education level: Bachelor's degree (e.g., BA, AB, BS)  Occupational History    Employer: [employer]  Tobacco Use   Smoking status: Every Day    Current packs/day: 0.50    Average packs/day: 0.5 packs/day for 10.0 years (5.0 ttl pk-yrs)    Types: Cigarettes   Smokeless tobacco: Never   Tobacco comments:    started smoking around 2008  Vaping Use   Vaping status: Never Used  Substance and Sexual Activity   Alcohol use: No    Comment: rarely   Drug use: No   Sexual activity: Yes  Other Topics Concern   Not on file  Social History Narrative   Not on file   Social Drivers of Health    Financial Resource Strain: Low Risk  (09/14/2022)   Overall Financial Resource Strain (CARDIA)    Difficulty of Paying Living Expenses: Not hard at all  Food Insecurity: No Food Insecurity (09/14/2022)   Hunger Vital Sign    Worried About Running Out of Food in the Last Year: Never true    Ran Out of Food in the Last Year: Never true  Transportation Needs: No Transportation Needs (09/14/2022)   PRAPARE - Administrator, Civil Service (Medical): No    Lack of Transportation (Non-Medical): No  Physical Activity: Insufficiently Active (09/14/2022)   Exercise Vital Sign    Days of Exercise per Week: 3 days    Minutes of Exercise per Session: 30 min  Stress: No Stress Concern Present (09/14/2022)   [employer] of Occupational Health - Occupational Stress Questionnaire    Feeling of Stress : Only a little  Social Connections: Socially Isolated (09/14/2022)   Social Connection and Isolation Panel [NHANES]    Frequency of Communication with Friends and Family: Once a week    Frequency of Social Gatherings with Friends and Family: Once a week    Attends Religious Services: Never    Database Administrator or Organizations: No    Attends Banker Meetings: Not on file    Marital Status: Married  Intimate Partner Violence: Not At Risk (06/23/2023)   Humiliation,  Afraid, Rape, and Kick questionnaire    Fear of Current or Ex-Partner: No    Emotionally Abused: No    Physically Abused: No    Sexually Abused: No    Past Medical History:  Diagnosis Date   Arthritis    mostly in b/l hands, left knee   Blood transfusion without reported diagnosis    Diabetes mellitus type 2, controlled, without complications (HCC) 10/11/2015   Diabetes mellitus without complication (HCC)    Family history of breast cancer    Family history of uterine cancer    Hyperlipidemia    Lower GI bleed 09/2015     Patient Active Problem List   Diagnosis Date Noted   Hyperlipidemia associated with  type 2 diabetes mellitus (HCC) 02/02/2021   Polyarthralgia 05/12/2020   Bilateral hand pain 05/12/2020   Family history of breast cancer    Family history of uterine cancer    H/O: GI bleed 03/23/2017   Tobacco abuse 03/23/2017   Colon polyps 03/23/2017   T2DM (type 2 diabetes mellitus) (HCC) 10/11/2015    Past Surgical History:  Procedure Laterality Date   ABDOMINAL HYSTERECTOMY     COLONOSCOPY WITH PROPOFOL  N/A 01/04/2020   Procedure: COLONOSCOPY WITH PROPOFOL ;  Surgeon: Therisa Bi, MD;  Location: [facility] ENDOSCOPY;  Service: Gastroenterology;  Laterality: N/A;   DIAGNOSTIC LAPAROSCOPY     EYE SURGERY Bilateral 02/21/1998   lasik   LAPAROSCOPIC SUPRACERVICAL HYSTERECTOMY  2010   partial   TONSILLECTOMY  09/21/1977   WISDOM TOOTH EXTRACTION Bilateral 1976    Her family history includes Alcohol abuse in her brother; Atrial fibrillation in her mother; Breast cancer (age of onset: 34) in her mother; Cancer (age of onset: 66) in an other family member; Diabetes in her father and mother; Drug abuse in her brother; Heart attack (age of onset: 15) in her father; Rheum arthritis in her brother; Stroke in her father; Uterine cancer (age of onset: 49) in her paternal grandmother.   Current Outpatient Medications:    atorvastatin  (LIPITOR) 40 MG tablet, Take 1 tablet (40 mg total) by mouth daily., Disp: 90 tablet, Rfl: 3   Cholecalciferol 25 MCG (1000 UT) tablet, Take 1 tablet by mouth daily., Disp: , Rfl:    empagliflozin  (JARDIANCE ) 25 MG TABS tablet, Take 1 tablet (25 mg total) by mouth daily before breakfast., Disp: 90 tablet, Rfl: 3   metFORMIN  (GLUCOPHAGE ) 500 MG tablet, Take 2 tablets (1,000 mg total) by mouth 2 (two) times daily., Disp: 360 tablet, Rfl: 3   pioglitazone  (ACTOS ) 30 MG tablet, Take 1 tablet (30 mg total) by mouth daily., Disp: 90 tablet, Rfl: 3   Vitamin Mixture (VITAMIN E COMPLETE PO), Take 1 tablet by mouth daily., Disp: , Rfl:    Patient Care Team: Myrla Jon HERO, MD as PCP - General (Family Medicine)  Review of Systems     Objective    Vitals: BP 134/66 (Cuff Size: Normal)   Pulse 73   Ht 5' 5 (1.651 m)   Wt 140 lb 1.6 oz (63.5 kg)   BMI 23.31 kg/m  No results found. Physical Exam Vitals reviewed.  Constitutional:      General: She is not in acute distress.    Appearance: Normal appearance. She is well-developed. She is not diaphoretic.  HENT:     Head: Normocephalic and atraumatic.  Eyes:     General: No scleral icterus.    Conjunctiva/sclera: Conjunctivae normal.  Neck:     Thyroid: No thyromegaly.  Cardiovascular:     Rate and Rhythm: Normal rate and regular rhythm.     Heart sounds: Normal heart sounds. No murmur heard. Pulmonary:     Effort: Pulmonary effort is normal. No respiratory distress.     Breath sounds: Normal breath sounds. No wheezing, rhonchi or rales.  Musculoskeletal:     Cervical back: Neck supple.     Right lower leg: No edema.     Left lower leg: No edema.  Lymphadenopathy:     Cervical: No cervical adenopathy.  Skin:    General: Skin is warm and dry.     Findings: No rash.  Neurological:     Mental Status: She is alert and oriented to person, place, and time. Mental status is at baseline.  Psychiatric:        Mood and Affect: Mood normal.        Behavior: Behavior normal.    Diabetic Foot Exam - Simple   Simple Foot Form Diabetic Foot exam was performed with the following findings: Yes 06/23/2023  9:49 AM  Visual Inspection No deformities, no ulcerations, no other skin breakdown bilaterally: Yes Sensation Testing Intact to touch and monofilament testing bilaterally: Yes Pulse Check Posterior Tibialis and Dorsalis pulse intact bilaterally: Yes Comments      Activities of Daily Living    09/14/2022    8:10 AM  In your present state of health, do you have any difficulty performing the following activities:  Hearing? 0  Vision? 0  Difficulty concentrating or making decisions? 0  Walking  or climbing stairs? 0  Dressing or bathing? 0  Doing errands, shopping? 0    Fall Risk Assessment    09/14/2022    8:10 AM 03/01/2022    3:43 PM 11/26/2021    3:21 PM 02/02/2021    4:00 PM 08/11/2020    4:44 PM  Fall Risk   Falls in the past year? 0 1 0 0 0  Number falls in past yr: 0 0 0 0 0  Injury with Fall? 0 1 0 0 0  Risk for fall due to : No Fall Risks No Fall Risks  No Fall Risks No Fall Risks  Follow up Falls evaluation completed Falls evaluation completed  Falls evaluation completed Falls evaluation completed     Depression Screen    09/14/2022    8:10 AM 03/01/2022    3:43 PM 11/26/2021    3:22 PM 02/02/2021    3:58 PM  PHQ 2/9 Scores  PHQ - 2 Score 0 0 0 0  PHQ- 9 Score 0 0  0        No data to display          EKG: NSR  No results found.   No results found for any visits on 06/23/23.  Assessment & Plan      Initial Preventative Physical Exam  Reviewed patient's Family Medical History Reviewed and updated list of patient's medical providers Assessment of cognitive impairment was done Assessed patient's functional ability Established a written schedule for health screening services Health Risk Assessent Completed and Reviewed  Exercise Activities and Dietary recommendations  Goals   None     Immunization History  Administered Date(s) Administered   Influenza,inj,Quad PF,6+ Mos 03/23/2017, 04/19/2018, 02/02/2019, 05/12/2020, 03/01/2022   Influenza-Unspecified 03/25/2021   PFIZER(Purple Top)SARS-COV-2 Vaccination 09/03/2019, 10/01/2019   Pneumococcal Polysaccharide-23 04/19/2018   Tdap 09/20/2009, 03/30/2014    Health Maintenance  Topic Date Due   Zoster Vaccines- Shingrix (1 of 2)  Never done   Pneumonia Vaccine 70+ Years old (2 of 2 - PCV) 04/20/2019   COVID-19 Vaccine (3 - Pfizer risk series) 10/29/2019   Colonoscopy  01/04/2023   DEXA SCAN  Never done   MAMMOGRAM  05/28/2023   INFLUENZA VACCINE  09/12/2023 (Originally 01/13/2023)    HEMOGLOBIN A1C  08/29/2023   Diabetic kidney evaluation - eGFR measurement  02/29/2024   Diabetic kidney evaluation - Urine ACR  02/29/2024   DTaP/Tdap/Td (3 - Td or Tdap) 03/30/2024   OPHTHALMOLOGY EXAM  04/05/2024   FOOT EXAM  06/22/2024   Medicare Annual Wellness (AWV)  06/22/2024   Hepatitis C Screening  Completed   HIV Screening  Completed   HPV VACCINES  Aged Out     Discussed health benefits of physical activity, and encouraged her to engage in regular exercise appropriate for her age and condition.   Problem List Items Addressed This Visit   None Visit Diagnoses       Welcome to Medicare preventive visit    -  Primary   Relevant Orders   EKG 12-Lead   DG Bone Density   MM 3D SCREENING MAMMOGRAM BILATERAL BREAST   Ambulatory referral to Gastroenterology     Postmenopausal       Relevant Orders   DG Bone Density     Breast cancer screening by mammogram       Relevant Orders   MM 3D SCREENING MAMMOGRAM BILATERAL BREAST     Colon cancer screening       Relevant Orders   Ambulatory referral to Gastroenterology           Welcome to Medicare Visit - Order EKG - Complete Welcome to Medicare questionnaire  Colorectal Cancer Screening Due for a colonoscopy; last one in 2021. Discussed difficulty with previous prep and pill prep options. - Schedule colonoscopy - Instruct to ask about pill prep options when contacted by the office  Breast Cancer Screening Due for a mammogram; last one in 2022. Discussed importance of annual mammograms for early detection. - Order mammogram  Osteoporosis Screening Due for a bone density scan at age 30. Discussed importance of screening to prevent fractures. - Order bone density scan - Coordinate scheduling with mammogram  Shingles Vaccination Interested in shingles vaccine; Medicare may cover cost. Instructed to check with pharmacy about coverage under Medicare Part D. - Check with pharmacy about shingles vaccine coverage under  Medicare Part D  Pneumococcal Vaccination Due for repeat pneumococcal vaccine; last one in 2019. Discussed importance of vaccination in preventing pneumonia. - Administer pneumococcal vaccine today  Influenza Vaccination No record of current season flu shot. Discussed importance of annual flu vaccination. - Administer flu vaccine today  Diabetes Mellitus Chronic condition requiring regular follow-up and lab work. Labs to be addressed in follow-up visit. Discussed importance of regular monitoring and management. - Schedule follow-up visit in March/April for diabetes management and lab work  Foot Exam Routine foot exam for diabetes management. No current foot issues noted. Discussed importance of regular foot exams to prevent complications. - Perform foot exam today  Follow-up - Schedule follow-up visit in March/April for diabetes management and lab work - Perform EKG at the end of today's visit.        Return in about 2 months (around 08/21/2023) for chronic disease f/u.     Jon Eva, MD  [facility] [phone] (phone) [phone] (fax)  [facility]

## 2023-06-27 ENCOUNTER — Telehealth: Payer: Self-pay

## 2023-06-27 ENCOUNTER — Other Ambulatory Visit: Payer: Self-pay

## 2023-06-27 DIAGNOSIS — Z860100 Personal history of colon polyps, unspecified: Secondary | ICD-10-CM

## 2023-06-27 MED ORDER — SUTAB 1479-225-188 MG PO TABS: 12.0000 | ORAL_TABLET | Freq: Two times a day (BID) | ORAL | 0 refills | Status: AC

## 2023-06-27 NOTE — Telephone Encounter (Signed)
 Gastroenterology Pre-Procedure Review  Request Date: 07/29/23 Requesting Physician: Dr. Therisa  PATIENT REVIEW QUESTIONS: The patient responded to the following health history questions as indicated:    1. Are you having any GI issues? no 2. Do you have a personal history of Polyps? yes (Last colonoscopy performed by Dr. Therisa 01/04/20 recommended repeat in 3 years) 3. Do you have a family history of Colon Cancer or Polyps? no 4. Diabetes Mellitus? yes (Pt takes jardiance  (3) day stop.  Metformin  (2) day stop Actos  (1) day stop.) 5. Joint replacements in the past 12 months?no 6. Major health problems in the past 3 months?no 7. Any artificial heart valves, MVP, or defibrillator?no    MEDICATIONS & ALLERGIES:    Patient reports the following regarding taking any anticoagulation/antiplatelet therapy:   Plavix, Coumadin, Eliquis, Xarelto, Lovenox, Pradaxa, Brilinta, or Effient? no Aspirin? no  Patient confirms/reports the following medications:  Current Outpatient Medications  Medication Sig Dispense Refill   Sodium Sulfate-Mag Sulfate-KCl (SUTAB ) [phone] MG TABS Take 12 tablets by mouth 2 (two) times daily for 1 day. 24 tablet 0   atorvastatin  (LIPITOR) 40 MG tablet Take 1 tablet (40 mg total) by mouth daily. 90 tablet 3   Cholecalciferol 25 MCG (1000 UT) tablet Take 1 tablet by mouth daily.     empagliflozin  (JARDIANCE ) 25 MG TABS tablet Take 1 tablet (25 mg total) by mouth daily before breakfast. 90 tablet 3   metFORMIN  (GLUCOPHAGE ) 500 MG tablet Take 2 tablets (1,000 mg total) by mouth 2 (two) times daily. 360 tablet 3   pioglitazone  (ACTOS ) 30 MG tablet Take 1 tablet (30 mg total) by mouth daily. 90 tablet 3   Vitamin Mixture (VITAMIN E COMPLETE PO) Take 1 tablet by mouth daily.     No current facility-administered medications for this visit.    Patient confirms/reports the following allergies:  No Known Allergies  No orders of the defined types were placed in this  encounter.   AUTHORIZATION INFORMATION Primary Insurance: 1D#: Group #:  Secondary Insurance: 1D#: Group #:  SCHEDULE INFORMATION: Date: 07/29/23 Time: Location: [facility]

## 2023-07-13 ENCOUNTER — Ambulatory Visit
Admission: RE | Admit: 2023-07-13 | Discharge: 2023-07-13 | Discharge status: Home / Self Care | Payer: Medicare Other | Source: Ambulatory Visit | Attending: Family Medicine

## 2023-07-13 ENCOUNTER — Ambulatory Visit
Admission: RE | Admit: 2023-07-13 | Discharge: 2023-07-13 | Disposition: A | Discharge status: Home / Self Care | Payer: Medicare Other | Source: Ambulatory Visit | Attending: Family Medicine | Admitting: Family Medicine

## 2023-07-13 DIAGNOSIS — Z0000 Encounter for general adult medical examination without abnormal findings: Secondary | ICD-10-CM

## 2023-07-13 DIAGNOSIS — Z780 Asymptomatic menopausal state: Secondary | ICD-10-CM | POA: Diagnosis present

## 2023-07-13 DIAGNOSIS — Z1231 Encounter for screening mammogram for malignant neoplasm of breast: Secondary | ICD-10-CM | POA: Diagnosis present

## 2023-07-14 ENCOUNTER — Encounter: Payer: Self-pay | Admitting: Family Medicine

## 2023-07-18 ENCOUNTER — Encounter: Payer: Self-pay | Admitting: Family Medicine

## 2023-07-29 ENCOUNTER — Ambulatory Visit
Admission: RE | Admit: 2023-07-29 | Discharge: 2023-07-29 | Disposition: A | Discharge status: Home / Self Care | Payer: Medicare Other | Attending: Gastroenterology | Admitting: Gastroenterology

## 2023-07-29 ENCOUNTER — Ambulatory Visit: Payer: Medicare Other | Admitting: Certified Registered"

## 2023-07-29 ENCOUNTER — Encounter: Payer: Self-pay | Admitting: Gastroenterology

## 2023-07-29 ENCOUNTER — Encounter
Admission: RE | Disposition: A | Discharge status: Home / Self Care | Payer: Self-pay | Source: Home / Self Care | Attending: Gastroenterology

## 2023-07-29 ENCOUNTER — Other Ambulatory Visit: Payer: Self-pay

## 2023-07-29 DIAGNOSIS — Z833 Family history of diabetes mellitus: Secondary | ICD-10-CM | POA: Insufficient documentation

## 2023-07-29 DIAGNOSIS — Z1211 Encounter for screening for malignant neoplasm of colon: Secondary | ICD-10-CM | POA: Diagnosis not present

## 2023-07-29 DIAGNOSIS — E119 Type 2 diabetes mellitus without complications: Secondary | ICD-10-CM | POA: Diagnosis not present

## 2023-07-29 DIAGNOSIS — F17210 Nicotine dependence, cigarettes, uncomplicated: Secondary | ICD-10-CM | POA: Insufficient documentation

## 2023-07-29 DIAGNOSIS — Z860101 Personal history of adenomatous and serrated colon polyps: Secondary | ICD-10-CM

## 2023-07-29 DIAGNOSIS — Z7984 Long term (current) use of oral hypoglycemic drugs: Secondary | ICD-10-CM | POA: Insufficient documentation

## 2023-07-29 DIAGNOSIS — Z860100 Personal history of colon polyps, unspecified: Secondary | ICD-10-CM

## 2023-07-29 HISTORY — PX: COLONOSCOPY WITH PROPOFOL: SHX5780

## 2023-07-29 LAB — GLUCOSE, CAPILLARY: Glucose-Capillary: 168 mg/dL — ABNORMAL HIGH (ref 70–99)

## 2023-07-29 SURGERY — COLONOSCOPY WITH PROPOFOL
Anesthesia: General

## 2023-07-29 MED ORDER — PROPOFOL 10 MG/ML IV BOLUS
INTRAVENOUS | Status: DC | PRN
  Administered 2023-07-29: 160 ug/kg/min via INTRAVENOUS
  Administered 2023-07-29: 100 mg via INTRAVENOUS

## 2023-07-29 MED ORDER — LIDOCAINE HCL (CARDIAC) PF 100 MG/5ML IV SOSY
PREFILLED_SYRINGE | INTRAVENOUS | Status: DC | PRN
  Administered 2023-07-29: 160 mg via INTRAVENOUS
  Administered 2023-07-29: 100 mg via INTRAVENOUS

## 2023-07-29 MED ORDER — PROPOFOL 10 MG/ML IV BOLUS
INTRAVENOUS | Status: AC
Start: 2023-07-29 — End: ?
  Filled 2023-07-29: qty 40

## 2023-07-29 MED ORDER — SODIUM CHLORIDE 0.9 % IV SOLN: INTRAVENOUS | Status: DC

## 2023-07-29 MED ORDER — LIDOCAINE HCL (PF) 2 % IJ SOLN
INTRAMUSCULAR | Status: AC
  Filled 2023-07-29: qty 5

## 2023-07-29 NOTE — Anesthesia Postprocedure Evaluation (Signed)
Anesthesia Post Note  Patient: Denise Torres  Procedure(s) Performed: COLONOSCOPY WITH PROPOFOL  Patient location during evaluation: Endoscopy Anesthesia Type: General Level of consciousness: awake and alert Pain management: pain level controlled Vital Signs Assessment: post-procedure vital signs reviewed and stable Respiratory status: spontaneous breathing, nonlabored ventilation, respiratory function stable and patient connected to nasal cannula oxygen Cardiovascular status: blood pressure returned to baseline and stable Postop Assessment: no apparent nausea or vomiting Anesthetic complications: no   No notable events documented.   Last Vitals:  Vitals:   07/29/23 0739 07/29/23 0807  BP: (!) 146/72 (!) 100/53  Pulse: 69 73  Resp: 18 19  Temp: (!) 36 C   SpO2: 99% 100%    Last Pain:  Vitals:   07/29/23 0807  TempSrc:   PainSc: Asleep                 Lenard Simmer

## 2023-07-29 NOTE — Transfer of Care (Signed)
Immediate Anesthesia Transfer of Care Note  Patient: Denise Torres  Procedure(s) Performed: COLONOSCOPY WITH PROPOFOL  Patient Location: Endoscopy Unit  Anesthesia Type:General  Level of Consciousness: awake, alert , and drowsy  Airway & Oxygen Therapy: Patient Spontanous Breathing  Post-op Assessment: Report given to RN and Post -op Vital signs reviewed and stable  Post vital signs: Reviewed and stable  Last Vitals:  Vitals Value Taken Time  BP 100/68 0806  Temp 35.9 0806  Pulse 68 0806  Resp 16 0806  SpO2 99 0806    Last Pain:  Vitals:   07/29/23 0739  TempSrc: Temporal         Complications: No notable events documented.

## 2023-07-29 NOTE — H&P (Signed)
Wyline Mood, MD [address], [place], [place], [address] [address], [place], [place], [address] Phone: [phone]  Fax: [phone]  Primary Care Physician:  Erasmo Downer, MD   Pre-Procedure History & Physical: HPI:  Denise Torres is a 68 y.o. female is here for an colonoscopy.   Past Medical History:  Diagnosis Date   Arthritis    mostly in b/l hands, left knee   Blood transfusion without reported diagnosis    Diabetes mellitus type 2, controlled, without complications (HCC) 10/11/2015   Diabetes mellitus without complication (HCC)    Family history of breast cancer    Family history of uterine cancer    Hyperlipidemia    Lower GI bleed 09/2015    Past Surgical History:  Procedure Laterality Date   ABDOMINAL HYSTERECTOMY     COLONOSCOPY     COLONOSCOPY WITH PROPOFOL N/A 01/04/2020   Procedure: COLONOSCOPY WITH PROPOFOL;  Surgeon: Wyline Mood, MD;  Location: [facility] ENDOSCOPY;  Service: Gastroenterology;  Laterality: N/A;   DIAGNOSTIC LAPAROSCOPY     EYE SURGERY Bilateral 02/21/1998   lasik   LAPAROSCOPIC SUPRACERVICAL HYSTERECTOMY  2010   partial   TONSILLECTOMY  09/21/1977   WISDOM TOOTH EXTRACTION Bilateral 1976    Prior to Admission medications   Medication Sig Start Date End Date Taking? Authorizing Provider  atorvastatin (LIPITOR) 40 MG tablet Take 1 tablet (40 mg total) by mouth daily. 02/22/23  Yes Bacigalupo, Marzella Schlein, MD  Cholecalciferol 25 MCG (1000 UT) tablet Take 1 tablet by mouth daily.   Yes [provider]  metFORMIN (GLUCOPHAGE) 500 MG tablet Take 2 tablets (1,000 mg total) by mouth 2 (two) times daily. 02/22/23  Yes Bacigalupo, Marzella Schlein, MD  pioglitazone (ACTOS) 30 MG tablet Take 1 tablet (30 mg total) by mouth daily. 02/22/23  Yes Bacigalupo, Marzella Schlein, MD  empagliflozin (JARDIANCE) 25 MG TABS tablet Take 1 tablet (25 mg total) by mouth daily before breakfast. 02/22/23   Bacigalupo, Marzella Schlein, MD  Vitamin Mixture  (VITAMIN E COMPLETE PO) Take 1 tablet by mouth daily.    [provider]    Allergies as of 06/27/2023   (No Known Allergies)    Family History  Problem Relation Age of Onset   Breast cancer Mother 29       s/p masectomy   Diabetes Mother    Atrial fibrillation Mother    Diabetes Father    Heart attack Father 39   Stroke Father    Rheum arthritis Brother    Alcohol abuse Brother    Drug abuse Brother    Uterine cancer Paternal Grandmother 40   Cancer Other 19       unknown type, maternal cousin's daughter    Social History   Socioeconomic History   Marital status: Married    Spouse name: Molly Maduro   Number of children: 2   Years of education: 16   Highest education level: Bachelor's degree (e.g., BA, AB, BS)  Occupational History    Employer: [employer]  Tobacco Use   Smoking status: Every Day    Current packs/day: 0.50    Average packs/day: 0.5 packs/day for 10.0 years (5.0 ttl pk-yrs)    Types: Cigarettes   Smokeless tobacco: Never   Tobacco comments:    started smoking around 2008  Vaping Use   Vaping status: Never Used  Substance and Sexual Activity   Alcohol use: No    Comment: rarely   Drug use:  No   Sexual activity: Yes  Other Topics Concern   Not on file  Social History Narrative   Not on file   Social Drivers of Health   Financial Resource Strain: Low Risk  (09/14/2022)   Overall Financial Resource Strain (CARDIA)    Difficulty of Paying Living Expenses: Not hard at all  Food Insecurity: No Food Insecurity (09/14/2022)   Hunger Vital Sign    Worried About Running Out of Food in the Last Year: Never true    Ran Out of Food in the Last Year: Never true  Transportation Needs: No Transportation Needs (09/14/2022)   PRAPARE - Administrator, Civil Service (Medical): No    Lack of Transportation (Non-Medical): No  Physical Activity: Insufficiently Active (09/14/2022)   Exercise Vital Sign    Days of Exercise per Week: 3 days     Minutes of Exercise per Session: 30 min  Stress: No Stress Concern Present (09/14/2022)   [employer] of Occupational Health - Occupational Stress Questionnaire    Feeling of Stress : Only a little  Social Connections: Socially Isolated (09/14/2022)   Social Connection and Isolation Panel [NHANES]    Frequency of Communication with Friends and Family: Once a week    Frequency of Social Gatherings with Friends and Family: Once a week    Attends Religious Services: Never    Database administrator or Organizations: No    Attends Engineer, structural: Not on file    Marital Status: Married  Catering manager Violence: Not At Risk (06/23/2023)   Humiliation, Afraid, Rape, and Kick questionnaire    Fear of Current or Ex-Partner: No    Emotionally Abused: No    Physically Abused: No    Sexually Abused: No    Review of Systems: See HPI, otherwise negative ROS  Physical Exam: BP (!) 146/72   Pulse 69   Temp (!) 96.8 F (36 C) (Temporal)   Resp 18   Ht 5\' 5"  (1.651 m)   Wt 63.6 kg   SpO2 99%   BMI 23.33 kg/m  General:   Alert,  pleasant and cooperative in NAD Head:  Normocephalic and atraumatic. Neck:  Supple; no masses or thyromegaly. Lungs:  Clear throughout to auscultation, normal respiratory effort.    Heart:  +S1, +S2, Regular rate and rhythm, No edema. Abdomen:  Soft, nontender and nondistended. Normal bowel sounds, without guarding, and without rebound.   Neurologic:  Alert and  oriented x4;  grossly normal neurologically.  Impression/Plan: Denise Torres is here for an colonoscopy to be performed for surveillance due to prior history of colon polyps   Risks, benefits, limitations, and alternatives regarding  colonoscopy have been reviewed with the patient.  Questions have been answered.  All parties agreeable.   Wyline Mood, MD  07/29/2023, 7:49 AM

## 2023-07-29 NOTE — Op Note (Signed)
[facility] Gastroenterology Patient Name: Denise Torres Procedure Date: 07/29/2023 7:54 AM MRN: [id number] Account #: [id number] Date of Birth: 1955-09-09 Admit Type: Outpatient Age: 68 Room: [facility] ENDO ROOM 2 Gender: Female Note Status: Finalized Instrument Name: Prentice Docker [id number] Procedure:             Colonoscopy Indications:           Surveillance: Personal history of adenomatous polyps                         on last colonoscopy 5 years ago Providers:             Wyline Mood MD, MD Referring MD:          Marzella Schlein. Bacigalupo (Referring MD) Medicines:             Monitored Anesthesia Care Complications:         No immediate complications. Procedure:             Pre-Anesthesia Assessment:                        - Prior to the procedure, a History and Physical was                         performed, and patient medications, allergies and                         sensitivities were reviewed. The patient's tolerance                         of previous anesthesia was reviewed.                        - The risks and benefits of the procedure and the                         sedation options and risks were discussed with the                         patient. All questions were answered and informed                         consent was obtained.                        - ASA Grade Assessment: II - A patient with mild                         systemic disease.                        After obtaining informed consent, the colonoscope was                         passed under direct vision. Throughout the procedure,                         the patient's blood pressure, pulse, and oxygen                         saturations  were monitored continuously. The                         Colonoscope was introduced through the anus with the                         intention of advancing to the cecum. The scope was                         advanced to the sigmoid colon before the procedure  was                         aborted. Medications were given. The colonoscopy was                         performed with ease. The patient tolerated the                         procedure well. The quality of the bowel preparation                         was inadequate. Findings:      A large amount of semi-liquid stool was found in the recto-sigmoid colon       and in the descending colon, interfering with visualization. Impression:            - Preparation of the colon was inadequate.                        - Stool in the recto-sigmoid colon and in the                         descending colon.                        - No specimens collected. Recommendation:        - Discharge patient to home (with escort).                        - Resume previous diet.                        - Continue present medications.                        - Repeat colonoscopy in 3 weeks because the bowel                         preparation was suboptimal. Procedure Code(s):     --- Professional ---                        [phone], 53, Colonoscopy, flexible; diagnostic,                         including collection of specimen(s) by brushing or                         washing, when performed (separate procedure) Diagnosis Code(s):     --- Professional ---  Z86.010, Personal history of colonic polyps CPT copyright 2022 American Medical Association. All rights reserved. The codes documented in this report are preliminary and upon coder review may  be revised to meet current compliance requirements. Wyline Mood, MD Wyline Mood MD, MD 07/29/2023 8:02:37 AM This report has been signed electronically. Number of Addenda: 0 Note Initiated On: 07/29/2023 7:54 AM Total Procedure Duration: 0 hours 2 minutes 30 seconds  Estimated Blood Loss:  Estimated blood loss: none.      [facility]

## 2023-07-29 NOTE — Brief Op Note (Signed)
Very poor prep. Colonoscopy aborted.

## 2023-07-29 NOTE — Anesthesia Preprocedure Evaluation (Signed)
Anesthesia Evaluation  Patient identified by MRN, date of birth, ID band Patient awake    Reviewed: Allergy & Precautions, H&P , NPO status , Patient's Chart, lab work & pertinent test results, reviewed documented beta blocker date and time   Airway Mallampati: I  TM Distance: >3 FB Neck ROM: full    Dental  (+) Dental Advidsory Given, Caps, Teeth Intact   Pulmonary neg shortness of breath, neg COPD, neg recent URI, Current Smoker   Pulmonary exam normal breath sounds clear to auscultation       Cardiovascular Exercise Tolerance: Good negative cardio ROS Normal cardiovascular exam Rhythm:regular Rate:Normal     Neuro/Psych negative neurological ROS  negative psych ROS   GI/Hepatic negative GI ROS, Neg liver ROS,,,  Endo/Other  diabetes, Well Controlled, Type 2, Oral Hypoglycemic Agents    Renal/GU negative Renal ROS  negative genitourinary   Musculoskeletal   Abdominal   Peds  Hematology negative hematology ROS (+)   Anesthesia Other Findings Past Medical History: No date: Arthritis     Comment:  mostly in b/l hands, left knee No date: Blood transfusion without reported diagnosis 10/11/2015: Diabetes mellitus type 2, controlled, without  complications (HCC) No date: Diabetes mellitus without complication (HCC) No date: Hyperlipidemia 09/2015: Lower GI bleed   Reproductive/Obstetrics negative OB ROS                             Anesthesia Physical Anesthesia Plan  ASA: 2  Anesthesia Plan: General   Post-op Pain Management:    Induction: Intravenous  PONV Risk Score and Plan: 2 and Propofol infusion and TIVA  Airway Management Planned: Natural Airway and Nasal Cannula  Additional Equipment:   Intra-op Plan:   Post-operative Plan:   Informed Consent: I have reviewed the patients History and Physical, chart, labs and discussed the procedure including the risks, benefits and  alternatives for the proposed anesthesia with the patient or authorized representative who has indicated his/her understanding and acceptance.     Dental Advisory Given  Plan Discussed with: Anesthesiologist, CRNA and Surgeon  Anesthesia Plan Comments:         Anesthesia Quick Evaluation

## 2023-08-01 ENCOUNTER — Encounter: Payer: Self-pay | Admitting: Gastroenterology

## 2023-08-22 ENCOUNTER — Ambulatory Visit (INDEPENDENT_AMBULATORY_CARE_PROVIDER_SITE_OTHER): Payer: Medicare Other | Admitting: Family Medicine

## 2023-08-22 ENCOUNTER — Encounter: Payer: Self-pay | Admitting: Family Medicine

## 2023-08-22 VITALS — BP 129/83 | HR 71 | Ht 65.0 in | Wt 140.3 lb

## 2023-08-22 DIAGNOSIS — E785 Hyperlipidemia, unspecified: Secondary | ICD-10-CM | POA: Diagnosis not present

## 2023-08-22 DIAGNOSIS — E1169 Type 2 diabetes mellitus with other specified complication: Secondary | ICD-10-CM | POA: Diagnosis not present

## 2023-08-22 DIAGNOSIS — E1165 Type 2 diabetes mellitus with hyperglycemia: Secondary | ICD-10-CM | POA: Diagnosis not present

## 2023-08-22 LAB — POCT GLYCOSYLATED HEMOGLOBIN (HGB A1C): Hemoglobin A1C: 7.3 % — AB (ref 4.0–5.6)

## 2023-08-22 MED ORDER — TIRZEPATIDE 2.5 MG/0.5ML ~~LOC~~ SOAJ: 2.5000 mg | SUBCUTANEOUS | 0 refills | Status: DC

## 2023-08-22 MED ORDER — TIRZEPATIDE 5 MG/0.5ML ~~LOC~~ SOAJ: 5.0000 mg | SUBCUTANEOUS | 1 refills | Status: DC

## 2023-08-22 MED ORDER — FREESTYLE LIBRE 3 SENSOR MISC: 11 refills | Status: DC

## 2023-08-22 NOTE — Assessment & Plan Note (Signed)
 Currently on Lipitor 40 mg daily. - Continue Lipitor 40 mg daily

## 2023-08-22 NOTE — Progress Notes (Signed)
 Established patient visit   Patient: Denise Torres   DOB: 04/14/1956   68 y.o. Female  MRN: [id number] Visit Date: 08/22/2023  Today's healthcare provider: Shirlee Latch, MD   Chief Complaint  Patient presents with   Medical Management of Chronic Issues    2 month follow up   Diabetes    A1c last checked 03/01/23. Patient reports taking medications as prescribed with no symptoms to report. She reports her blood sugar this morning was 109 mg/dL but usually around the 120's.   Subjective    Diabetes   HPI     Medical Management of Chronic Issues    Additional comments: 2 month follow up        Diabetes    Additional comments: A1c last checked 03/01/23. Patient reports taking medications as prescribed with no symptoms to report. She reports her blood sugar this morning was 109 mg/dL but usually around the 120's.      Last edited by Acey Lav, CMA on 08/22/2023  8:15 AM.       Discussed the use of AI scribe software for clinical note transcription with the patient, who gave verbal consent to proceed.  History of Present Illness   A 68 year old patient with a history of type 2 diabetes and hyperlipidemia presents for a follow-up visit to monitor her blood sugar levels. She is currently on Lipitor 40mg  daily, Jardiance 25mg  daily, Metformin 1000mg  bid, and Actos 30mg  daily. The patient reports no specific concerns related to her diabetes or hyperlipidemia management. However, she expresses frustration over a recent failed colonoscopy due to vomiting from the prep. She describes the experience as physically and emotionally draining and plans to reschedule the procedure for the summer. The patient also discusses her father's uncontrolled diabetes and the stress it causes her. She mentions her father's frequent hospital visits and his fear of falling asleep and not waking up due to his high blood sugar levels.       Medications: Outpatient Medications Prior to Visit   Medication Sig   atorvastatin (LIPITOR) 40 MG tablet Take 1 tablet (40 mg total) by mouth daily.   Cholecalciferol 25 MCG (1000 UT) tablet Take 1 tablet by mouth daily.   empagliflozin (JARDIANCE) 25 MG TABS tablet Take 1 tablet (25 mg total) by mouth daily before breakfast.   metFORMIN (GLUCOPHAGE) 500 MG tablet Take 2 tablets (1,000 mg total) by mouth 2 (two) times daily.   Vitamin Mixture (VITAMIN E COMPLETE PO) Take 1 tablet by mouth daily.   [DISCONTINUED] pioglitazone (ACTOS) 30 MG tablet Take 1 tablet (30 mg total) by mouth daily.   No facility-administered medications prior to visit.    Review of Systems     Objective    BP 129/83 (BP Location: Left Arm, Patient Position: Sitting, Cuff Size: Normal)   Pulse 71   Ht 5\' 5"  (1.651 m)   Wt 140 lb 4.8 oz (63.6 kg)   BMI 23.35 kg/m    Physical Exam Vitals reviewed.  Constitutional:      General: She is not in acute distress.    Appearance: Normal appearance. She is well-developed. She is not diaphoretic.  HENT:     Head: Normocephalic and atraumatic.  Eyes:     General: No scleral icterus.    Conjunctiva/sclera: Conjunctivae normal.  Neck:     Thyroid: No thyromegaly.  Cardiovascular:     Rate and Rhythm: Normal rate and regular rhythm.     Heart sounds: Normal  heart sounds. No murmur heard. Pulmonary:     Effort: Pulmonary effort is normal. No respiratory distress.     Breath sounds: Normal breath sounds. No wheezing, rhonchi or rales.  Musculoskeletal:     Cervical back: Neck supple.     Right lower leg: No edema.     Left lower leg: No edema.  Lymphadenopathy:     Cervical: No cervical adenopathy.  Skin:    General: Skin is warm and dry.     Findings: No rash.  Neurological:     Mental Status: She is alert and oriented to person, place, and time. Mental status is at baseline.  Psychiatric:        Mood and Affect: Mood normal.        Behavior: Behavior normal.      Results for orders placed or  performed in visit on 08/22/23  POCT HgB A1C  Result Value Ref Range   Hemoglobin A1C 7.3 (A) 4.0 - 5.6 %   HbA1c POC (<> result, manual entry)     HbA1c, POC (prediabetic range)     HbA1c, POC (controlled diabetic range)      Assessment & Plan     Problem List Items Addressed This Visit       Endocrine   T2DM (type 2 diabetes mellitus) (HCC) - Primary   A1c increased from 7.0% to 7.3%. Current medications include Jardiance 25 mg daily, metformin 1000 mg BID, and Actos 30 mg daily. Discussed switching from Actos to [facility], a GLP-1 receptor agonist, due to improved drug coverage. Greggory Keen is administered as a once-weekly injection with potential side effects including mild gastrointestinal upset and weight loss, and has less nausea compared to other GLP-1 agonists. Discussed the use of a continuous glucose monitor (CGM) for easier glucose monitoring. Patient is amenable to switching medications and trying CGM. - Discontinue Actos - Start Mounjaro 2.5 mg once weekly for 4 weeks, then increase to 5 mg once weekly - Continue Jardiance 25 mg daily - Continue metformin 1000 mg BID - Order A1c in 3 months - Discuss CGM with patient      Relevant Medications   tirzepatide (MOUNJARO) 2.5 MG/0.5ML Pen   tirzepatide (MOUNJARO) 5 MG/0.5ML Pen   Other Relevant Orders   POCT HgB A1C (Completed)   Comprehensive metabolic panel   Lipid panel   Hyperlipidemia associated with type 2 diabetes mellitus (HCC)   Currently on Lipitor 40 mg daily. - Continue Lipitor 40 mg daily      Relevant Medications   tirzepatide (MOUNJARO) 2.5 MG/0.5ML Pen   tirzepatide (MOUNJARO) 5 MG/0.5ML Pen   Other Relevant Orders   Comprehensive metabolic panel   Lipid panel       Colonoscopy Preparation Intolerance Experienced significant nausea and vomiting with previous colonoscopy prep, leading to an incomplete procedure. Discussed using anti-nausea medication and different prep solutions in the future.  Patient prefers to delay the procedure until summer due to the physical and emotional toll of the preparation process. - Consider anti-nausea medication for future colonoscopy prep - Discuss alternative colonoscopy prep solutions with GI  General Health Maintenance Up to date with general health maintenance. Discussed the importance of continuing regular screenings and vaccinations. - Order cholesterol, kidney, and liver function tests      Return in about 3 months (around 11/22/2023) for chronic disease f/u.       Shirlee Latch, MD  [facility] [phone] (phone) [phone] (fax)  [facility]

## 2023-08-22 NOTE — Assessment & Plan Note (Signed)
 A1c increased from 7.0% to 7.3%. Current medications include Jardiance 25 mg daily, metformin 1000 mg BID, and Actos 30 mg daily. Discussed switching from Actos to [facility], a GLP-1 receptor agonist, due to improved drug coverage. Greggory Keen is administered as a once-weekly injection with potential side effects including mild gastrointestinal upset and weight loss, and has less nausea compared to other GLP-1 agonists. Discussed the use of a continuous glucose monitor (CGM) for easier glucose monitoring. Patient is amenable to switching medications and trying CGM. - Discontinue Actos - Start Mounjaro 2.5 mg once weekly for 4 weeks, then increase to 5 mg once weekly - Continue Jardiance 25 mg daily - Continue metformin 1000 mg BID - Order A1c in 3 months - Discuss CGM with patient

## 2023-08-23 ENCOUNTER — Encounter: Payer: Self-pay | Admitting: Family Medicine

## 2023-08-23 LAB — COMPREHENSIVE METABOLIC PANEL
ALT: 13 IU/L (ref 0–32)
AST: 22 IU/L (ref 0–40)
Albumin: 4.6 g/dL (ref 3.9–4.9)
Alkaline Phosphatase: 82 IU/L (ref 44–121)
BUN/Creatinine Ratio: 14 (ref 12–28)
BUN: 10 mg/dL (ref 8–27)
Bilirubin Total: 0.4 mg/dL (ref 0.0–1.2)
CO2: 22 mmol/L (ref 20–29)
Calcium: 9.3 mg/dL (ref 8.7–10.3)
Chloride: 103 mmol/L (ref 96–106)
Creatinine, Ser: 0.72 mg/dL (ref 0.57–1.00)
Globulin, Total: 2 g/dL (ref 1.5–4.5)
Glucose: 143 mg/dL — ABNORMAL HIGH (ref 70–99)
Potassium: 4.2 mmol/L (ref 3.5–5.2)
Sodium: 144 mmol/L (ref 134–144)
Total Protein: 6.6 g/dL (ref 6.0–8.5)
eGFR: 93 mL/min/{1.73_m2} (ref >59)

## 2023-08-23 LAB — LIPID PANEL
Chol/HDL Ratio: 2 ratio (ref 0.0–4.4)
Cholesterol, Total: 140 mg/dL (ref 100–199)
HDL: 69 mg/dL (ref >39)
LDL Chol Calc (NIH): 57 mg/dL (ref 0–99)
Triglycerides: 73 mg/dL (ref 0–149)
VLDL Cholesterol Cal: 14 mg/dL (ref 5–40)

## 2023-09-06 ENCOUNTER — Telehealth: Payer: Self-pay

## 2023-09-06 NOTE — Telephone Encounter (Signed)
 Left message to return call to office- patient needs repeat colonoscopy  due to not being cleaned out -patient will need to hold Jardiance 3 days, mounjaro 7 days, metformin 2 days- she used sutab last colonoscopy 07/29/23- may need different prep this time-no blood thinners listed.-schedule with Dr.Anna.

## 2023-09-19 ENCOUNTER — Telehealth: Payer: Self-pay

## 2023-09-19 NOTE — Telephone Encounter (Signed)
 Patient called and says she was started on Mounjaro 2.5 mg and she used it all and says she guess she's supposed to f/u. Advised last OV note says to start Mounjaro 2.5 x 4 weeks, then increase to 5 g and noted to f/u in 3 months, which an appointment is scheduled. She says she doesn't have the 5 mg, advised it was sent to [facility] the same day the 2.5 mg prescription was sent. She says they will probably not have the Rx, advised they put it on file until it's time to fill. She says she will call them. Advised to call back if it's not there.  Copied from CRM [id number]. Topic: Clinical - Medication Question >> Sep 19, 2023  9:35 AM Nyra Capes wrote: Reason for CRM: Patient calling in and has questions regarding this medication   Medication: tirzepatide Greggory Keen) 5 MG/0.5ML Pen  Patient would like a call back to answer these questions  phone # [phone]

## 2023-09-22 NOTE — Telephone Encounter (Signed)
 Please send in 3 month supply of the mounjaro 5mg  weekly. This is the next step for her.

## 2023-09-22 NOTE — Telephone Encounter (Signed)
 Rx previously sent. Pt is aware to fill and headed to pick up

## 2023-11-23 NOTE — Progress Notes (Signed)
 Established patient visit   Patient: Denise Torres   DOB: [birth year]   68 y.o. Female  MRN: [id number] Visit Date: 11/24/2023  Today's healthcare provider: Aden Agreste, MD   Chief Complaint  Patient presents with   Medical Management of Chronic Issues   Diabetes    Pt reports taking medication as prescribed and currently using 5 mg mounjaro. Monitors at home with 90 day avg being 115, 30 day avg 106, 14 days 105, 7 days 102. She reports symptoms of fatigue   Hyperlipidemia   Subjective    HPI HPI     Diabetes    Additional comments: Pt reports taking medication as prescribed and currently using 5 mg mounjaro. Monitors at home with 90 day avg being 115, 30 day avg 106, 14 days 105, 7 days 102. She reports symptoms of fatigue      Last edited by Pasty Bongo, CMA on 11/24/2023  8:41 AM.       Discussed the use of AI scribe software for clinical note transcription with the patient, who gave verbal consent to proceed.  History of Present Illness   Denise Torres is a 68 year old female with diabetes who presents with concerns about Mounjaro side effects and weight loss.  She experiences nausea and vomiting primarily on weekends following her Friday dose of Mounjaro, leading to significant fatigue and multiple naps on Sundays. She has lost 20 pounds, resulting in flabby skin and reduced energy for exercise. She is concerned about losing muscle mass and discomfort when sitting due to reduced body fat. Her blood sugar averages around 130.  She has been on a 5 mg dose of Mitigare for over two months, starting from a 2.5 mg dose. Her current medications include Jardiance  25 mg daily, Metformin  1000 mg twice daily, and Lipitor 40 mg for cholesterol management.  She has difficulty with colonoscopy prep due to nausea and vomiting, which she attributes to Mounjaro, and requires anti-nausea medication for management. She mentions a recent shingles vaccination that caused  significant fatigue, coinciding with her father's death and bereavement leave.         Medications: Outpatient Medications Prior to Visit  Medication Sig   atorvastatin  (LIPITOR) 40 MG tablet Take 1 tablet (40 mg total) by mouth daily.   Cholecalciferol 25 MCG (1000 UT) tablet Take 1 tablet by mouth daily.   empagliflozin  (JARDIANCE ) 25 MG TABS tablet Take 1 tablet (25 mg total) by mouth daily before breakfast.   metFORMIN  (GLUCOPHAGE ) 500 MG tablet Take 2 tablets (1,000 mg total) by mouth 2 (two) times daily.   Vitamin Mixture (VITAMIN E COMPLETE PO) Take 1 tablet by mouth daily.   [DISCONTINUED] tirzepatide (MOUNJARO) 5 MG/0.5ML Pen Inject 5 mg into the skin once a week.   [DISCONTINUED] Continuous Glucose Sensor (FREESTYLE LIBRE 3 SENSOR) MISC Place 1 sensor on the skin every 14 days. Use to check glucose continuously (Patient not taking: Reported on 11/24/2023)   [DISCONTINUED] tirzepatide (MOUNJARO) 2.5 MG/0.5ML Pen Inject 2.5 mg into the skin once a week. (Patient not taking: Reported on 11/24/2023)   No facility-administered medications prior to visit.    Review of Systems     Objective    BP 109/88   Pulse 83   Ht 5' 5 (1.651 m)   Wt 121 lb 14.4 oz (55.3 kg)   SpO2 100%   BMI 20.29 kg/m    Physical Exam Vitals reviewed.  Constitutional:      General: She  is not in acute distress.    Appearance: Normal appearance. She is well-developed. She is not diaphoretic.  HENT:     Head: Normocephalic and atraumatic.   Eyes:     General: No scleral icterus.    Conjunctiva/sclera: Conjunctivae normal.   Neck:     Thyroid: No thyromegaly.   Cardiovascular:     Rate and Rhythm: Normal rate and regular rhythm.     Heart sounds: Normal heart sounds. No murmur heard. Pulmonary:     Effort: Pulmonary effort is normal. No respiratory distress.     Breath sounds: Normal breath sounds. No wheezing, rhonchi or rales.   Musculoskeletal:     Cervical back: Neck supple.      Right lower leg: No edema.     Left lower leg: No edema.  Lymphadenopathy:     Cervical: No cervical adenopathy.   Skin:    General: Skin is warm and dry.     Findings: No rash.   Neurological:     Mental Status: She is alert and oriented to person, place, and time. Mental status is at baseline.   Psychiatric:        Mood and Affect: Mood normal.        Behavior: Behavior normal.      No results found for any visits on 11/24/23.  Assessment & Plan     Problem List Items Addressed This Visit       Endocrine   T2DM (type 2 diabetes mellitus) (HCC) - Primary   Type 2 diabetes mellitus is well-controlled with an A1c of 6.1. Current management includes Mounjaro, Jardiance , and metformin . Mounjaro's impact on blood sugar levels and weight loss was discussed. Decision made to reduce Mounjaro dose to improve quality of life and manage side effects. - Continue Jardiance  25 mg daily - Continue metformin  1000 mg twice daily - Switch Mounjaro to 2.5 mg due to side effects - Prescribe continuous glucose monitor through [employer] - Monitor A1c in 3 months      Relevant Medications   tirzepatide (MOUNJARO) 2.5 MG/0.5ML Pen   Other Relevant Orders   POCT HgB A1C   Hyperlipidemia associated with type 2 diabetes mellitus (HCC)   Hyperlipidemia managed with Lipitor 40 mg. - Continue Lipitor 40 mg daily       Relevant Medications   tirzepatide (MOUNJARO) 2.5 MG/0.5ML Pen     Other   Tobacco abuse   Patient is currently precontemplative  Will reassess at next visit            Nausea and vomiting due to [facility] Nausea and vomiting occur primarily on weekends after Mounjaro administration on Fridays. Discussed anti-nausea medication for symptom management during colonoscopy prep and Mounjaro use. She prefers to avoid regular use but will have it available if needed. - Prescribe Zofran for nausea, to be used as needed - Switch Mounjaro to 2.5 mg to reduce side  effects  Fatigue due to Mounjaro Fatigue associated with Mounjaro, particularly after increasing the dose to 5 mg, impacts daily activities and exercise capacity. Decision made to reduce Mounjaro dose to improve quality of life. - Switch Mounjaro to 2.5 mg to reduce fatigue  Weight loss due to Mounjaro Significant weight loss of 20 pounds with Mounjaro raises concerns about muscle loss and flabbiness. Discussed balance between weight loss and quality of life, and impact on exercise capacity. Decision made to reduce Mounjaro dose to stabilize weight loss and improve energy levels. - Switch Mounjaro to 2.5  mg to stabilize weight loss - Encourage gradual reintroduction of exercise, starting with beginner yoga       Return in about 3 months (around 02/24/2024) for chronic disease f/u.       Aden Agreste, MD  [facility] [phone] (phone) [phone] (fax)  [facility]

## 2023-11-24 ENCOUNTER — Encounter: Payer: Self-pay | Admitting: Family Medicine

## 2023-11-24 ENCOUNTER — Ambulatory Visit: Admitting: Family Medicine

## 2023-11-24 VITALS — BP 109/88 | HR 83 | Ht 65.0 in | Wt 121.9 lb

## 2023-11-24 DIAGNOSIS — Z720 Tobacco use: Secondary | ICD-10-CM | POA: Diagnosis not present

## 2023-11-24 DIAGNOSIS — E1165 Type 2 diabetes mellitus with hyperglycemia: Secondary | ICD-10-CM | POA: Diagnosis not present

## 2023-11-24 DIAGNOSIS — Z7985 Long-term (current) use of injectable non-insulin antidiabetic drugs: Secondary | ICD-10-CM

## 2023-11-24 DIAGNOSIS — E1169 Type 2 diabetes mellitus with other specified complication: Secondary | ICD-10-CM

## 2023-11-24 DIAGNOSIS — E785 Hyperlipidemia, unspecified: Secondary | ICD-10-CM

## 2023-11-24 LAB — POCT GLYCOSYLATED HEMOGLOBIN (HGB A1C): Hemoglobin A1C: 6.1 % — AB (ref 4.0–5.6)

## 2023-11-24 MED ORDER — ONDANSETRON HCL 4 MG PO TABS: 4.0000 mg | ORAL_TABLET | Freq: Three times a day (TID) | ORAL | 0 refills | Status: AC | PRN

## 2023-11-24 MED ORDER — FREESTYLE LIBRE 3 PLUS SENSOR MISC
3 refills | Status: AC
Start: 2023-11-24 — End: ?

## 2023-11-24 MED ORDER — TIRZEPATIDE 2.5 MG/0.5ML ~~LOC~~ SOAJ: 2.5000 mg | SUBCUTANEOUS | 1 refills | Status: DC

## 2023-11-24 MED ORDER — FREESTYLE LIBRE 3 PLUS SENSOR MISC: 3 refills | Status: DC

## 2023-11-24 NOTE — Assessment & Plan Note (Signed)
 Hyperlipidemia managed with Lipitor 40 mg. - Continue Lipitor 40 mg daily

## 2023-11-24 NOTE — Assessment & Plan Note (Signed)
Patient is currently precontemplative  ?Will reassess at next visit  ?

## 2023-11-24 NOTE — Assessment & Plan Note (Signed)
 Type 2 diabetes mellitus is well-controlled with an A1c of 6.1. Current management includes Mounjaro, Jardiance , and metformin . Mounjaro's impact on blood sugar levels and weight loss was discussed. Decision made to reduce Mounjaro dose to improve quality of life and manage side effects. - Continue Jardiance  25 mg daily - Continue metformin  1000 mg twice daily - Switch Mounjaro to 2.5 mg due to side effects - Prescribe continuous glucose monitor through [employer] - Monitor A1c in 3 months

## 2023-11-28 ENCOUNTER — Other Ambulatory Visit: Payer: Self-pay

## 2023-11-28 MED ORDER — FREESTYLE LIBRE 3 PLUS SENSOR MISC: 3 refills | Status: DC

## 2023-12-06 NOTE — Telephone Encounter (Signed)
 Okay to send regular glucometer instead of CGM

## 2023-12-07 MED ORDER — BLOOD GLUCOSE TEST VI STRP: 1.0000 | ORAL_STRIP | Freq: Three times a day (TID) | 11 refills | Status: AC

## 2023-12-07 MED ORDER — LANCET DEVICE MISC: 1.0000 | Freq: Three times a day (TID) | 0 refills | Status: AC

## 2023-12-07 MED ORDER — BLOOD GLUCOSE MONITORING SUPPL DEVI: 1.0000 | Freq: Three times a day (TID) | 0 refills | Status: AC

## 2023-12-07 MED ORDER — LANCETS MISC. MISC: 1.0000 | Freq: Three times a day (TID) | 11 refills | Status: AC

## 2023-12-07 NOTE — Addendum Note (Signed)
 Addended by: LILIAN SEVERO RAMAN on: 12/07/2023 01:37 PM   Modules accepted: Orders

## 2023-12-15 ENCOUNTER — Other Ambulatory Visit: Payer: Self-pay

## 2023-12-15 DIAGNOSIS — E1165 Type 2 diabetes mellitus with hyperglycemia: Secondary | ICD-10-CM

## 2024-01-03 ENCOUNTER — Other Ambulatory Visit: Payer: Self-pay

## 2024-01-03 DIAGNOSIS — E1165 Type 2 diabetes mellitus with hyperglycemia: Secondary | ICD-10-CM

## 2024-01-03 MED ORDER — ONETOUCH ULTRASOFT 2 LANCETS MISC
1.0000 | Freq: Three times a day (TID) | 4 refills | Status: AC
Start: 2024-01-03 — End: ?

## 2024-01-03 MED ORDER — ONETOUCH ULTRA 2 W/DEVICE KIT: PACK | 0 refills | Status: AC

## 2024-01-30 ENCOUNTER — Other Ambulatory Visit: Payer: Self-pay | Admitting: Family Medicine

## 2024-01-30 DIAGNOSIS — E1165 Type 2 diabetes mellitus with hyperglycemia: Secondary | ICD-10-CM

## 2024-01-30 DIAGNOSIS — E785 Hyperlipidemia, unspecified: Secondary | ICD-10-CM

## 2024-02-27 ENCOUNTER — Ambulatory Visit: Admitting: Family Medicine

## 2024-02-27 ENCOUNTER — Encounter: Payer: Self-pay | Admitting: Family Medicine

## 2024-02-27 VITALS — BP 117/86 | HR 86 | Ht 65.0 in | Wt 110.0 lb

## 2024-02-27 DIAGNOSIS — E785 Hyperlipidemia, unspecified: Secondary | ICD-10-CM | POA: Diagnosis not present

## 2024-02-27 DIAGNOSIS — R636 Underweight: Secondary | ICD-10-CM | POA: Diagnosis not present

## 2024-02-27 DIAGNOSIS — E1165 Type 2 diabetes mellitus with hyperglycemia: Secondary | ICD-10-CM | POA: Diagnosis not present

## 2024-02-27 DIAGNOSIS — Z23 Encounter for immunization: Secondary | ICD-10-CM | POA: Diagnosis not present

## 2024-02-27 DIAGNOSIS — E1169 Type 2 diabetes mellitus with other specified complication: Secondary | ICD-10-CM | POA: Diagnosis not present

## 2024-02-27 LAB — POCT GLYCOSYLATED HEMOGLOBIN (HGB A1C): Hemoglobin A1C: 5.7 % — AB (ref 4.0–5.6)

## 2024-02-27 MED ORDER — METFORMIN HCL 500 MG PO TABS: 500.0000 mg | ORAL_TABLET | Freq: Two times a day (BID) | ORAL | 1 refills | Status: AC

## 2024-02-27 NOTE — Assessment & Plan Note (Signed)
 Hyperlipidemia is currently managed with atorvastatin . Recent cholesterol levels were in a good range, indicating effective control.

## 2024-02-27 NOTE — Progress Notes (Signed)
 Established patient visit   Patient: Denise Torres   DOB: 02/03/1956   68 y.o. Female  MRN: [id number] Visit Date: 02/27/2024  Today's healthcare provider: Jon Eva, MD   Chief Complaint  Patient presents with   Medical Management of Chronic Issues   Diabetes    She reports taking medications as prescribed. Reports avg blood sugar 90 mg/dL fasting. Symptom nausea   Hyperlipidemia   Subjective    Diabetes  Hyperlipidemia   HPI     Diabetes    Additional comments: She reports taking medications as prescribed. Reports avg blood sugar 90 mg/dL fasting. Symptom nausea      Last edited by Lilian Fitzpatrick, CMA on 02/27/2024  8:43 AM.       Discussed the use of AI scribe software for clinical note transcription with the patient, who gave verbal consent to proceed.  History of Present Illness   Denise Torres is a 68 year old female with type 2 diabetes and hyperlipidemia who presents for chronic follow-up.  Her type 2 diabetes is well-controlled with an A1c of 5.7%. Blood sugar levels typically range in the 80s and 90s, occasionally dropping to the 60s, and sometimes exceeding 100. She is concerned about hypoglycemia, particularly when levels fall below 70. Current medications include Jardiance  25 mg daily, metformin  1000 mg twice daily, and Mounjaro  2.5 mg weekly. She experiences gastrointestinal side effects from Mounjaro  and suspects metformin  may contribute.  She has experienced significant weight loss, with a BMI of 18.3, and current weight at 110 pounds. She struggles to gain weight despite eating as much as possible. She finds it challenging to consume certain foods, such as protein shakes and large meals, but can manage smaller portions and specific foods like fajitas and [place] food. She lacks hunger pains and has difficulty consuming a whole [employer] sandwich.  She describes decreased physical strength and endurance, with difficulty in activities such as  walking around the block or performing yard work. She feels weak and experiences shortness of breath with exertion. She engages in chair yoga and other exercises to build muscle but finds some exercises challenging due to knee pain and sciatica.  She mentions a previous failed attempt at a colonoscopy due to severe nausea and vomiting during the preparation process, which was emotionally and physically taxing. She is considering retrying the colonoscopy with anti-nausea medication and possibly adjusting her medication regimen to alleviate gastrointestinal symptoms.         Medications: Outpatient Medications Prior to Visit  Medication Sig   atorvastatin  (LIPITOR) 40 MG tablet TAKE 1 TABLET DAILY   Blood Glucose Monitoring Suppl (ONE TOUCH ULTRA 2) w/Device KIT Use to check blood sugar in the morning, at noon, and at bedtime   Blood Glucose Monitoring Suppl DEVI 1 each by Does not apply route in the morning, at noon, and at bedtime. May substitute to any manufacturer covered by patient's insurance.   Cholecalciferol 25 MCG (1000 UT) tablet Take 1 tablet by mouth daily.   JARDIANCE  25 MG TABS tablet TAKE 1 TABLET DAILY BEFORE BREAKFAST   ondansetron  (ZOFRAN ) 4 MG tablet Take 1 tablet (4 mg total) by mouth every 8 (eight) hours as needed for nausea or vomiting.   OneTouch UltraSoft 2 Lancets MISC 1 each by Does not apply route 3 (three) times daily before meals.   tirzepatide  (MOUNJARO ) 2.5 MG/0.5ML Pen Inject 2.5 mg into the skin once a week.   Vitamin Mixture (VITAMIN E COMPLETE PO) Take 1  tablet by mouth daily.   [DISCONTINUED] Continuous Glucose Sensor (FREESTYLE LIBRE 3 PLUS SENSOR) MISC Change sensor every 15 days.   [DISCONTINUED] metFORMIN  (GLUCOPHAGE ) 500 MG tablet TAKE 2 TABLETS TWICE A DAY   No facility-administered medications prior to visit.    Review of Systems     Objective    BP 117/86 (BP Location: Left Arm, Patient Position: Sitting, Cuff Size: Normal)   Pulse 86   Ht  5' 5 (1.651 m)   Wt 110 lb (49.9 kg)   SpO2 99%   BMI 18.30 kg/m    Physical Exam Vitals reviewed.  Constitutional:      General: She is not in acute distress.    Appearance: Normal appearance. She is well-developed. She is not diaphoretic.  HENT:     Head: Normocephalic and atraumatic.  Eyes:     General: No scleral icterus.    Conjunctiva/sclera: Conjunctivae normal.  Neck:     Thyroid: No thyromegaly.  Cardiovascular:     Rate and Rhythm: Normal rate and regular rhythm.     Heart sounds: Normal heart sounds. No murmur heard. Pulmonary:     Effort: Pulmonary effort is normal. No respiratory distress.     Breath sounds: Normal breath sounds. No wheezing, rhonchi or rales.  Musculoskeletal:     Cervical back: Neck supple.     Right lower leg: No edema.     Left lower leg: No edema.  Lymphadenopathy:     Cervical: No cervical adenopathy.  Skin:    General: Skin is warm and dry.     Findings: No rash.  Neurological:     Mental Status: She is alert and oriented to person, place, and time. Mental status is at baseline.  Psychiatric:        Mood and Affect: Mood normal.        Behavior: Behavior normal.      Results for orders placed or performed in visit on 02/27/24  POCT HgB A1C  Result Value Ref Range   Hemoglobin A1C 5.7 (A) 4.0 - 5.6 %   HbA1c POC (<> result, manual entry)     HbA1c, POC (prediabetic range)     HbA1c, POC (controlled diabetic range)      Assessment & Plan     Problem List Items Addressed This Visit       Endocrine   T2DM (type 2 diabetes mellitus) (HCC) - Primary   Type 2 diabetes mellitus is well-controlled with an A1c of 5.7%. Experiencing gastrointestinal symptoms and significant weight loss, likely due to the combination of metformin  and Mounjaro . Current BMI is 18.3, indicating underweight status. Mounjaro  appears to be more effective than metformin  in controlling blood sugar levels. The goal is to reduce gastrointestinal side effects  and stabilize weight while maintaining good glycemic control. - Reduce metformin  to 500 mg twice daily. - Monitor blood sugar levels; if stable, discontinue metformin  entirely. - Reassess in three months with an A1c test.      Relevant Medications   metFORMIN  (GLUCOPHAGE ) 500 MG tablet   Other Relevant Orders   POCT HgB A1C (Completed)   Comprehensive metabolic panel with GFR   Urine Microalbumin w/creat. ratio   Hyperlipidemia associated with type 2 diabetes mellitus (HCC)   Hyperlipidemia is currently managed with atorvastatin . Recent cholesterol levels were in a good range, indicating effective control.      Relevant Medications   metFORMIN  (GLUCOPHAGE ) 500 MG tablet   Other Visit Diagnoses  Immunization due       Relevant Orders   Flu vaccine HIGH DOSE PF(Fluzone Trivalent)     Underweight               Underweight BMI is 18.3, which is underweight. Significant weight loss attributed to diabetes medications, particularly metformin  and Mounjaro . Experiencing difficulty in consuming adequate nutrition due to gastrointestinal side effects. Desire to gain weight and build muscle mass. - Reduce metformin  to potentially alleviate gastrointestinal side effects and improve appetite. - Encourage increased protein intake and consider reintroducing protein shakes if tolerated. - Monitor weight and nutritional intake.       Return in about 3 months (around 05/28/2024) for chronic disease f/u also needs AWV with [facility] in January.       Jon Eva, MD  [facility] [phone] (phone) [phone] (fax)  [facility]

## 2024-02-27 NOTE — Assessment & Plan Note (Signed)
 Type 2 diabetes mellitus is well-controlled with an A1c of 5.7%. Experiencing gastrointestinal symptoms and significant weight loss, likely due to the combination of metformin  and Mounjaro . Current BMI is 18.3, indicating underweight status. Mounjaro  appears to be more effective than metformin  in controlling blood sugar levels. The goal is to reduce gastrointestinal side effects and stabilize weight while maintaining good glycemic control. - Reduce metformin  to 500 mg twice daily. - Monitor blood sugar levels; if stable, discontinue metformin  entirely. - Reassess in three months with an A1c test.

## 2024-02-28 LAB — COMPREHENSIVE METABOLIC PANEL WITH GFR
ALT: 68 IU/L — ABNORMAL HIGH (ref 0–32)
AST: 65 IU/L — ABNORMAL HIGH (ref 0–40)
Albumin: 4.7 g/dL (ref 3.9–4.9)
Alkaline Phosphatase: 99 IU/L (ref 51–125)
BUN/Creatinine Ratio: 13 (ref 12–28)
BUN: 9 mg/dL (ref 8–27)
Bilirubin Total: 0.5 mg/dL (ref 0.0–1.2)
CO2: 20 mmol/L (ref 20–29)
Calcium: 9.6 mg/dL (ref 8.7–10.3)
Chloride: 100 mmol/L (ref 96–106)
Creatinine, Ser: 0.68 mg/dL (ref 0.57–1.00)
Globulin, Total: 2.8 g/dL (ref 1.5–4.5)
Glucose: 112 mg/dL — ABNORMAL HIGH (ref 70–99)
Potassium: 3.8 mmol/L (ref 3.5–5.2)
Sodium: 139 mmol/L (ref 134–144)
Total Protein: 7.5 g/dL (ref 6.0–8.5)
eGFR: 97 mL/min/1.73 (ref >59)

## 2024-02-28 LAB — MICROALBUMIN / CREATININE URINE RATIO
Creatinine, Urine: 78.9 mg/dL
Microalb/Creat Ratio: 567 mg/g{creat} — ABNORMAL HIGH (ref 0–29)
Microalbumin, Urine: 447.7 ug/mL

## 2024-02-29 ENCOUNTER — Ambulatory Visit: Payer: Self-pay | Admitting: Family Medicine

## 2024-03-05 ENCOUNTER — Ambulatory Visit: Payer: Self-pay

## 2024-03-05 NOTE — Telephone Encounter (Signed)
 Patient called back,  per Dr. Myrla, this RN read MD's recommendations/results:   Normal/stable labs, except urine protein levels are quite high. This is from damage to the kidneys from diabetes. Recommend hydrating well and adding losartan  12.5 mg daily (half of a 25 mg pill) (CMAs send in #15 r2). We'll recheck at follow up in 3 months   Patient voiced concerns of taking blood pressure medication. Patient reports my blood pressure is not HIGH, but runs lower. I understand it will help with kidneys, but don't want to effect blood pressure. Is there any alternatives?  Last BP 117/86 during LOV 02/27/24

## 2024-03-05 NOTE — Telephone Encounter (Signed)
 Though this is a blood pressure medicaiton, we use it at very low doses like this one to protect the kidneys without significantly affecting the BP.  I think this is safe on both fronts.

## 2024-03-05 NOTE — Telephone Encounter (Signed)
 Yes. Ok to send #[facility] r1 to [facility]

## 2024-03-06 ENCOUNTER — Other Ambulatory Visit: Payer: Self-pay

## 2024-03-06 MED ORDER — LOSARTAN POTASSIUM 25 MG PO TABS: 12.5000 mg | ORAL_TABLET | Freq: Every day | ORAL | 0 refills | Status: DC

## 2024-03-06 NOTE — Progress Notes (Signed)
 Seen by patient Denise Torres on 03/03/2024 10:46 AM

## 2024-03-27 ENCOUNTER — Telehealth: Payer: Self-pay

## 2024-03-27 DIAGNOSIS — Z860100 Personal history of colon polyps, unspecified: Secondary | ICD-10-CM

## 2024-03-27 NOTE — Telephone Encounter (Signed)
 Pt contacted office to schedule her repeat colonoscopy.  She has been advised that I will need to call her back when the scope schedule is open for [facility].  Previous colonoscopy was with Dr. Therisa 07/29/23 but poor preparation with Sutab .  Pt stated that it made her extremely nauseated.  Asked if she had any problems with the Suprep colonoscopy prep this was the one she used prior to this one.    She said it made her nauseated as well, but she has Zofran  on hand to help with nausea.  I will call her back when the scope schedule is open for [facility].  Thanks,  [place], CMA

## 2024-04-11 LAB — OPHTHALMOLOGY REPORT-SCANNED

## 2024-04-14 ENCOUNTER — Encounter: Payer: Self-pay | Admitting: Family Medicine

## 2024-04-14 DIAGNOSIS — E1165 Type 2 diabetes mellitus with hyperglycemia: Secondary | ICD-10-CM

## 2024-04-16 ENCOUNTER — Other Ambulatory Visit: Payer: Self-pay

## 2024-04-16 DIAGNOSIS — Z860100 Personal history of colon polyps, unspecified: Secondary | ICD-10-CM

## 2024-04-16 MED ORDER — NA SULFATE-K SULFATE-MG SULF 17.5-3.13-1.6 GM/177ML PO SOLN
1.0000 | Freq: Once | ORAL | 0 refills | Status: AC
Start: 2024-04-16 — End: 2024-04-16

## 2024-04-16 NOTE — Addendum Note (Signed)
 Addended by: CURTISS ROSALINE RAMAN on: 04/16/2024 02:15 PM   Modules accepted: Orders

## 2024-04-16 NOTE — Telephone Encounter (Signed)
 Pt has been contacted to schedule her repeat colonoscopy due to poor prep-unable to tolerate Sutab .  Prep for this colonoscopy is Suprep.  Advised that if she still has Zofran  she can use this if she experiences any nausea with the Suprep.  Colonoscopy scheduled with Dr. Melany on Friday 07/06/24 at [facility].  Thanks,  [place], CMA

## 2024-04-17 MED ORDER — TIRZEPATIDE 2.5 MG/0.5ML ~~LOC~~ SOAJ: 2.5000 mg | SUBCUTANEOUS | 1 refills | Status: DC

## 2024-04-23 ENCOUNTER — Other Ambulatory Visit: Payer: Self-pay

## 2024-04-23 DIAGNOSIS — E1165 Type 2 diabetes mellitus with hyperglycemia: Secondary | ICD-10-CM

## 2024-04-23 MED ORDER — TIRZEPATIDE 2.5 MG/0.5ML ~~LOC~~ SOAJ: 2.5000 mg | SUBCUTANEOUS | 1 refills | Status: AC

## 2024-05-28 ENCOUNTER — Ambulatory Visit: Admitting: Family Medicine

## 2024-05-28 VITALS — BP 133/79 | HR 76 | Ht 65.0 in | Wt 119.8 lb

## 2024-05-28 DIAGNOSIS — J069 Acute upper respiratory infection, unspecified: Secondary | ICD-10-CM

## 2024-05-28 DIAGNOSIS — E1165 Type 2 diabetes mellitus with hyperglycemia: Secondary | ICD-10-CM

## 2024-05-28 DIAGNOSIS — E1169 Type 2 diabetes mellitus with other specified complication: Secondary | ICD-10-CM

## 2024-05-28 DIAGNOSIS — E785 Hyperlipidemia, unspecified: Secondary | ICD-10-CM

## 2024-05-28 DIAGNOSIS — Z7985 Long-term (current) use of injectable non-insulin antidiabetic drugs: Secondary | ICD-10-CM | POA: Diagnosis not present

## 2024-05-28 LAB — POCT GLYCOSYLATED HEMOGLOBIN (HGB A1C): Hemoglobin A1C: 6.3 % — AB (ref 4.0–5.6)

## 2024-05-28 NOTE — Progress Notes (Signed)
 Established patient visit   Patient: Denise Torres   DOB: [birth year]   68 y.o. Female  MRN: [id number] Visit Date: 05/28/2024  Today's healthcare provider: Jon Eva, MD   Chief Complaint  Patient presents with   Medical Management of Chronic Issues   Diabetes    Last seen for diabetes 3 months ago.  Management since then includes reduce metformin  to 500 mg twice daily. She reports excellent compliance with treatment. She is not having side effects.  Symptoms: sluggish bowel movements, better appetite, excessive thirst, Home blood sugar records: fasting range: 100 mg/dL Episodes of hypoglycemia? No   Current insulin regiment: none Most Recent Eye Exam: 04/11/24 Current exercise: none    Hyperlipidemia   Subjective    Diabetes  Hyperlipidemia   HPI     Diabetes    Additional comments: Last seen for diabetes 3 months ago.  Management since then includes reduce metformin  to 500 mg twice daily. She reports excellent compliance with treatment. She is not having side effects.  Symptoms: sluggish bowel movements, better appetite, excessive thirst, Home blood sugar records: fasting range: 100 mg/dL Episodes of hypoglycemia? No   Current insulin regiment: none Most Recent Eye Exam: 04/11/24 Current exercise: none       Last edited by Lilian Fitzpatrick, CMA on 05/28/2024  1:27 PM.       Discussed the use of AI scribe software for clinical note transcription with the patient, who gave verbal consent to proceed.  History of Present Illness   Denise Torres is a 68 year old female with type 2 diabetes and hyperlipidemia who presents for a follow-up visit.  She is currently taking losartan  12.5 mg daily, Lipitor 40 mg daily, Jardiance  25 mg daily, metformin  500 mg twice daily, and Mounjaro  2.5 mg weekly. Her A1c increased from 5.7 to 6.3 after the metformin  dose reduction. Home blood sugars are slightly higher, averaging 95-100.  She has had recent weight  gain with BMI back in the normal range and increased appetite, including better tolerance of Ensure since starting Mounjaro . She is comfortable with her weight change.  She returned from travel to [place]  with a head cold, including sore throat, rhinorrhea, sneezing, and congestion. She has been taking Nyquil with improvement. She denies fever, myalgias, or significant headaches, aside from one brief frontal headache.  She has not exercised for three weeks due to illness and a busy schedule. She plans to resume her light strengthening routine once she feels better.  After reducing metformin , her bowel movements improved from being too loose. She occasionally takes a small amount of Miralax if she has several days without a bowel movement.         Medications: Show/hide medication list[1]  Review of Systems     Objective    BP 133/79 (BP Location: Left Arm, Patient Position: Sitting, Cuff Size: Normal)   Pulse 76   Ht 5' 5 (1.651 m)   Wt 119 lb 12.8 oz (54.3 kg)   SpO2 98%   BMI 19.94 kg/m    Physical Exam Vitals reviewed.  Constitutional:      General: She is not in acute distress.    Appearance: Normal appearance. She is well-developed. She is not diaphoretic.  HENT:     Head: Normocephalic and atraumatic.  Eyes:     General: No scleral icterus.    Conjunctiva/sclera: Conjunctivae normal.  Neck:     Thyroid: No thyromegaly.  Cardiovascular:     Rate and Rhythm:  Normal rate and regular rhythm.     Heart sounds: Normal heart sounds. No murmur heard. Pulmonary:     Effort: Pulmonary effort is normal. No respiratory distress.     Breath sounds: Normal breath sounds. No wheezing, rhonchi or rales.  Musculoskeletal:     Cervical back: Neck supple.     Right lower leg: No edema.     Left lower leg: No edema.  Lymphadenopathy:     Cervical: No cervical adenopathy.  Skin:    General: Skin is warm and dry.  Neurological:     Mental Status: She is alert. Mental status is  at baseline.  Psychiatric:        Mood and Affect: Mood normal.        Behavior: Behavior normal.      Results for orders placed or performed in visit on 05/28/24  POCT glycosylated hemoglobin (Hb A1C)  Result Value Ref Range   Hemoglobin A1C 6.3 (A) 4.0 - 5.6 %   HbA1c POC (<> result, manual entry)     HbA1c, POC (prediabetic range)     HbA1c, POC (controlled diabetic range)      Assessment & Plan     Problem List Items Addressed This Visit       Endocrine   T2DM (type 2 diabetes mellitus) (HCC) - Primary   A1c increased from 5.7% to 6.3% after reducing metformin  to 500 mg BID. Blood glucose levels slightly elevated but remain under 100 mg/dL. Current A1c is within acceptable range for optimal outcomes. No significant hypoglycemia risk. Bowel movements improved with reduced metformin  dosage. - Continue current diabetes medications: losartan  12.5 mg daily, Lipitor 40 mg daily, Jardiance  25 mg daily, metformin  500 mg BID, Mounjaro  2.5 mg weekly. - Will recheck A1c in 3 months.      Relevant Orders   POCT glycosylated hemoglobin (Hb A1C) (Completed)   Hyperlipidemia associated with type 2 diabetes mellitus (HCC)   Managed with Lipitor 40 mg daily. - Continue Lipitor 40 mg daily. - Will recheck lipid panel in 3 months.      Other Visit Diagnoses       Viral upper respiratory tract infection               Acute upper respiratory infection Recent head cold with symptoms of sore throat, runny nose, and sneezing. Symptoms improving with over-the-counter medications. No fever or significant respiratory distress. Lungs clear on examination. - Continue symptomatic treatment with over-the-counter medications as needed.       Return in about 3 months (around 08/26/2024) for chronic disease f/u.      I personally spent a total of 25 minutes in the care of the patient today including preparing to see the patient, getting/reviewing separately obtained history, performing a  medically appropriate exam/evaluation, counseling and educating, placing orders, documenting clinical information in the EHR, independently interpreting results, and communicating results.   Jon Eva, MD  [facility] [phone] (phone) [phone] (fax)  [facility]     [1]  Outpatient Medications Prior to Visit  Medication Sig   losartan  (COZAAR ) 25 MG tablet Take 0.5 tablets (12.5 mg total) by mouth daily.   atorvastatin  (LIPITOR) 40 MG tablet TAKE 1 TABLET DAILY   Blood Glucose Monitoring Suppl (ONE TOUCH ULTRA 2) w/Device KIT Use to check blood sugar in the morning, at noon, and at bedtime   Blood Glucose Monitoring Suppl DEVI 1 each by Does not apply route in the morning,  at noon, and at bedtime. May substitute to any manufacturer covered by patient's insurance.   Cholecalciferol 25 MCG (1000 UT) tablet Take 1 tablet by mouth daily.   JARDIANCE  25 MG TABS tablet TAKE 1 TABLET DAILY BEFORE BREAKFAST   metFORMIN  (GLUCOPHAGE ) 500 MG tablet Take 1 tablet (500 mg total) by mouth 2 (two) times daily.   ondansetron  (ZOFRAN ) 4 MG tablet Take 1 tablet (4 mg total) by mouth every 8 (eight) hours as needed for nausea or vomiting.   OneTouch UltraSoft 2 Lancets MISC 1 each by Does not apply route 3 (three) times daily before meals.   tirzepatide  (MOUNJARO ) 2.5 MG/0.5ML Pen Inject 2.5 mg into the skin once a week.   Vitamin Mixture (VITAMIN E COMPLETE PO) Take 1 tablet by mouth daily.   No facility-administered medications prior to visit.

## 2024-05-28 NOTE — Assessment & Plan Note (Signed)
 A1c increased from 5.7% to 6.3% after reducing metformin  to 500 mg BID. Blood glucose levels slightly elevated but remain under 100 mg/dL. Current A1c is within acceptable range for optimal outcomes. No significant hypoglycemia risk. Bowel movements improved with reduced metformin  dosage. - Continue current diabetes medications: losartan  12.5 mg daily, Lipitor 40 mg daily, Jardiance  25 mg daily, metformin  500 mg BID, Mounjaro  2.5 mg weekly. - Will recheck A1c in 3 months.

## 2024-05-28 NOTE — Assessment & Plan Note (Signed)
 Managed with Lipitor 40 mg daily. - Continue Lipitor 40 mg daily. - Will recheck lipid panel in 3 months.

## 2024-06-03 ENCOUNTER — Other Ambulatory Visit: Payer: Self-pay | Admitting: Family Medicine

## 2024-07-03 ENCOUNTER — Ambulatory Visit

## 2024-07-03 VITALS — BP 120/72 | Ht 65.0 in | Wt 120.5 lb

## 2024-07-03 DIAGNOSIS — Z1231 Encounter for screening mammogram for malignant neoplasm of breast: Secondary | ICD-10-CM

## 2024-07-03 DIAGNOSIS — Z0000 Encounter for general adult medical examination without abnormal findings: Secondary | ICD-10-CM

## 2024-07-03 NOTE — Patient Instructions (Addendum)
 [name],  Thank you for taking the time for your Medicare Wellness Visit. I appreciate your continued commitment to your health goals. Please review the care plan we discussed, and feel free to reach out if I can assist you further.  Please note that Annual Wellness Visits do not include a physical exam. Some assessments may be limited, especially if the visit was conducted virtually. If needed, we may recommend an in-person follow-up with your provider.  Ongoing Care Seeing your primary care provider every 3 to 6 months helps us  monitor your health and provide consistent, personalized care. 09/06/24 @ 9:00 AM APPT. W/ [email or web address]  Referrals If a referral was made during today's visit and you haven't received any updates within two weeks, please contact the referred provider directly to check on the status.  REFERRAL FOR MAMMOGRAM SENT You have an order for:  []   2D Mammogram  [x]   3D Mammogram  []   Bone Density     Please call for appointment:  [facility]  [address] [place] [address] [phone] [facility] [address] [place], [place] [address] [phone] [facility] [address] [place], [place] [address] [phone]   Make sure to wear two-piece clothing.  No lotions, powders, or deodorants the day of the appointment. Make sure to bring picture ID and insurance card.  Bring list of medications you are currently taking including any supplements.   Schedule your [place] screening mammogram through MyChart!   Log into your MyChart account.  Go to 'Visit' (or 'Appointments' if on mobile App) --> Schedule an Appointment  Under 'Select a Reason for Visit' choose the Mammogram Screening option.  Complete the pre-visit questions and select the time and place that best fits your schedule.   Recommended Screenings:  Health  Maintenance  Topic Date Due   COVID-19 Vaccine (3 - Pfizer risk series) 10/29/2019   DTaP/Tdap/Td vaccine (3 - Td or Tdap) 03/30/2024   Medicare Annual Wellness Visit  06/22/2024   Complete foot exam   06/22/2024   Breast Cancer Screening  07/12/2024   Kidney health urinalysis for diabetes  08/26/2024   Hemoglobin A1C  11/26/2024   Yearly kidney function blood test for diabetes  02/26/2025   Eye exam for diabetics  04/11/2025   Osteoporosis screening with Bone Density Scan  07/12/2025   Colon Cancer Screening  07/28/2026   Pneumococcal Vaccine for age over 42  Completed   Flu Shot  Completed   Hepatitis C Screening  Completed   Zoster (Shingles) Vaccine  Completed   Meningitis B Vaccine  Aged Out    Vision: Annual vision screenings are recommended for early detection of glaucoma, cataracts, and diabetic retinopathy. These exams can also reveal signs of chronic conditions such as diabetes and high blood pressure.  Dental: Annual dental screenings help detect early signs of oral cancer, gum disease, and other conditions linked to overall health, including heart disease and diabetes.  Please see the attached documents for additional preventive care recommendations.   NEXT AWV 07/09/25 @ 1:50 PM IN PERSON

## 2024-07-03 NOTE — Progress Notes (Signed)
 "  Chief Complaint  Patient presents with   Medicare Wellness     Subjective:   Denise Torres is a 69 y.o. female who presents for a Medicare Annual Wellness Visit.  Visit info / Clinical Intake: Medicare Wellness Visit Type:: Subsequent Annual Wellness Visit Persons participating in visit and providing information:: patient Medicare Wellness Visit Mode:: In-person (required for WTM) Interpreter Needed?: No Pre-visit prep was completed: yes AWV questionnaire completed by patient prior to visit?: yes Date:: 06/30/24 Living arrangements:: lives with spouse/significant other Patient's Overall Health Status Rating: good Typical amount of pain: none Does pain affect daily life?: no Are you currently prescribed opioids?: no  Dietary Habits and Nutritional Risks How many meals a day?: 3 Eats fruit and vegetables daily?: yes Most meals are obtained by: preparing own meals; eating out In the last 2 weeks, have you had any of the following?: none Diabetic:: (!) yes Any non-healing wounds?: no How often do you check your BS?: 1 (every morning) Would you like to be referred to a Nutritionist or for Diabetic Management? : no  Functional Status Activities of Daily Living (to include ambulation/medication): Independent Ambulation: Independent Medication Administration: Independent Home Management (perform basic housework or laundry): Independent Manage your own finances?: yes Primary transportation is: driving Concerns about vision?: no *vision screening is required for WTM* (wears glasses all day- [facility]) Concerns about hearing?: no  Fall Screening Falls in the past year?: 0 Number of falls in past year: 0 Was there an injury with Fall?: 0 Fall Risk Category Calculator: 0 Patient Fall Risk Level: Low Fall Risk  Fall Risk Patient at Risk for Falls Due to: No Fall Risks Fall risk Follow up: Falls evaluation completed; Falls prevention discussed  Home and Transportation  Safety: All rugs have non-skid backing?: (!) no All stairs or steps have railings?: yes Grab bars in the bathtub or shower?: yes Have non-skid surface in bathtub or shower?: yes Good home lighting?: yes Regular seat belt use?: yes Hospital stays in the last year:: no  Cognitive Assessment Difficulty concentrating, remembering, or making decisions? : no Will 6CIT or Mini Cog be Completed: yes What year is it?: 0 points What month is it?: 0 points Give patient an address phrase to remember (5 components): [address] [place], [place] About what time is it?: 0 points Count backwards from 20 to 1: 0 points Say the months of the year in reverse: 0 points Repeat the address phrase from earlier: 0 points 6 CIT Score: 0 points  Advance Directives (For Healthcare) Does Patient Have a Medical Advance Directive?: No Would patient like information on creating a medical advance directive?: No - Patient declined  Reviewed/Updated  Reviewed/Updated: Reviewed All (Medical, Surgical, Family, Medications, Allergies, Care Teams, Patient Goals)    Allergies (verified) Patient has no known allergies.   Current Medications (verified) Outpatient Encounter Medications as of 07/03/2024  Medication Sig   atorvastatin  (LIPITOR) 40 MG tablet TAKE 1 TABLET DAILY   Blood Glucose Monitoring Suppl DEVI 1 each by Does not apply route in the morning, at noon, and at bedtime. May substitute to any manufacturer covered by patient's insurance.   Cholecalciferol 25 MCG (1000 UT) tablet Take 1 tablet by mouth daily.   JARDIANCE  25 MG TABS tablet TAKE 1 TABLET DAILY BEFORE BREAKFAST   losartan  (COZAAR ) 25 MG tablet TAKE ONE-HALF (1/2) TABLET DAILY   metFORMIN  (GLUCOPHAGE ) 500 MG tablet Take 1 tablet (500 mg total) by mouth 2 (two) times daily.  ondansetron  (ZOFRAN ) 4 MG tablet Take 1 tablet (4 mg total) by mouth every 8 (eight) hours as needed for nausea or vomiting.   OneTouch UltraSoft 2 Lancets MISC 1 each by  Does not apply route 3 (three) times daily before meals.   tirzepatide  (MOUNJARO ) 2.5 MG/0.5ML Pen Inject 2.5 mg into the skin once a week.   Vitamin Mixture (VITAMIN E COMPLETE PO) Take 1 tablet by mouth daily.   Blood Glucose Monitoring Suppl (ONE TOUCH ULTRA 2) w/Device KIT Use to check blood sugar in the morning, at noon, and at bedtime   No facility-administered encounter medications on file as of 07/03/2024.    History: Past Medical History:  Diagnosis Date   Arthritis    mostly in b/l hands, left knee   Blood transfusion without reported diagnosis    Cataract    Diabetes mellitus type 2, controlled, without complications (HCC) 10/11/2015   Diabetes mellitus without complication (HCC)    Family history of breast cancer    Family history of uterine cancer    Hyperlipidemia    Lower GI bleed 09/2015   Past Surgical History:  Procedure Laterality Date   ABDOMINAL HYSTERECTOMY     COLONOSCOPY     COLONOSCOPY WITH PROPOFOL  N/A 01/04/2020   Procedure: COLONOSCOPY WITH PROPOFOL ;  Surgeon: Therisa Bi, MD;  Location: [facility] ENDOSCOPY;  Service: Gastroenterology;  Laterality: N/A;   COLONOSCOPY WITH PROPOFOL  N/A 07/29/2023   Procedure: COLONOSCOPY WITH PROPOFOL ;  Surgeon: Therisa Bi, MD;  Location: [facility] ENDOSCOPY;  Service: Gastroenterology;  Laterality: N/A;   DIAGNOSTIC LAPAROSCOPY     EYE SURGERY Bilateral 02/21/1998   lasik   LAPAROSCOPIC SUPRACERVICAL HYSTERECTOMY  2010   partial   TONSILLECTOMY  09/21/1977   WISDOM TOOTH EXTRACTION Bilateral 1976   Family History  Problem Relation Age of Onset   Breast cancer Mother 53       s/p masectomy   Diabetes Mother    Atrial fibrillation Mother    Diabetes Father    Heart attack Father 43   Stroke Father    Rheum arthritis Brother    Alcohol abuse Brother    Drug abuse Brother    Uterine cancer Paternal Grandmother 50   Cancer Other 19       unknown type, maternal cousin's daughter   Social History   Occupational  History    Employer: [employer]  Tobacco Use   Smoking status: Every Day    Current packs/day: 0.50    Average packs/day: 0.5 packs/day for 10.0 years (5.0 ttl pk-yrs)    Types: Cigarettes   Smokeless tobacco: Never   Tobacco comments:    started smoking around 2008  Vaping Use   Vaping status: Never Used  Substance and Sexual Activity   Alcohol use: Not Currently    Alcohol/week: 1.0 standard drink of alcohol    Types: 1 Glasses of wine per week    Comment: rarely   Drug use: Never   Sexual activity: Yes    Birth control/protection: None   Tobacco Counseling Ready to quit: No Counseling given: Not Answered Tobacco comments: started smoking around 2008  SDOH Screenings   Food Insecurity: No Food Insecurity (07/03/2024)  Housing: Unknown (07/03/2024)  Transportation Needs: No Transportation Needs (07/03/2024)  Utilities: Not At Risk (07/03/2024)  Alcohol Screen: Low Risk (05/28/2024)  Depression (PHQ2-9): Low Risk (07/03/2024)  Financial Resource Strain: Low Risk (05/28/2024)  Physical Activity: Insufficiently Active (07/03/2024)  Social Connections: Moderately Isolated (07/03/2024)  Stress: No  Stress Concern Present (07/03/2024)  Tobacco Use: High Risk (07/03/2024)  Health Literacy: Adequate Health Literacy (07/03/2024)   See flowsheets for full screening details  Depression Screen PHQ 2 & 9 Depression Scale- Over the past 2 weeks, how often have you been bothered by any of the following problems? Little interest or pleasure in doing things: 0 Feeling down, depressed, or hopeless (PHQ Adolescent also includes...irritable): 0 PHQ-2 Total Score: 0 Trouble falling or staying asleep, or sleeping too much: 0 Feeling tired or having little energy: 0 Poor appetite or overeating (PHQ Adolescent also includes...weight loss): 0 Feeling bad about yourself - or that you are a failure or have let yourself or your family down: 0 Trouble concentrating on things, such as reading  the newspaper or watching television (PHQ Adolescent also includes...like school work): 0 Moving or speaking so slowly that other people could have noticed. Or the opposite - being so fidgety or restless that you have been moving around a lot more than usual: 0 Thoughts that you would be better off dead, or of hurting yourself in some way: 0 PHQ-9 Total Score: 0 If you checked off any problems, how difficult have these problems made it for you to do your work, take care of things at home, or get along with other people?: Not difficult at all  Depression Treatment Depression Interventions/Treatment : EYV7-0 Score <4 Follow-up Not Indicated     Goals Addressed             This Visit's Progress    DIET - EAT MORE FRUITS AND VEGETABLES               Objective:    Today's Vitals   07/03/24 1539  BP: 120/72  Weight: 120 lb 8 oz (54.7 kg)  Height: 5' 5 (1.651 m)   Body mass index is 20.05 kg/m.  Hearing/Vision screen Hearing Screening - Comments:: NO AIDS Vision Screening - Comments:: GLASSES- [facility]- NO DIABETIC RETINOPATHY- YEARLY Immunizations and Health Maintenance Health Maintenance  Topic Date Due   COVID-19 Vaccine (3 - Pfizer risk series) 10/29/2019   DTaP/Tdap/Td (3 - Td or Tdap) 03/30/2024   Medicare Annual Wellness (AWV)  06/22/2024   FOOT EXAM  06/22/2024   Mammogram  07/12/2024   Diabetic kidney evaluation - Urine ACR  08/26/2024   HEMOGLOBIN A1C  11/26/2024   Diabetic kidney evaluation - eGFR measurement  02/26/2025   OPHTHALMOLOGY EXAM  04/11/2025   Bone Density Scan  07/12/2025   Colonoscopy  07/28/2026   Pneumococcal Vaccine: 50+ Years  Completed   Influenza Vaccine  Completed   Hepatitis C Screening  Completed   Zoster Vaccines- Shingrix  Completed   Meningococcal B Vaccine  Aged Out        Assessment/Plan:  This is a routine wellness examination for Denise Torres.  Patient Care Team: Myrla Jon HERO, MD as PCP - General (Family  Medicine) [facility], [facility])  I have personally reviewed and noted the following in the patients chart:   Medical and social history Use of alcohol, tobacco or illicit drugs  Current medications and supplements including opioid prescriptions. Functional ability and status Nutritional status Physical activity Advanced directives List of other physicians Hospitalizations, surgeries, and ER visits in previous 12 months Vitals Screenings to include cognitive, depression, and falls Referrals and appointments  Orders Placed This Encounter  Procedures   MM 3D SCREENING MAMMOGRAM BILATERAL BREAST    Standing Status:   Future    Expiration Date:  07/03/2025    Reason for Exam (SYMPTOM  OR DIAGNOSIS REQUIRED):   SCREENING FOR BREAST CANCER    Preferred imaging location?:   [facility]    Release to patient:   Immediate   In addition, I have reviewed and discussed with patient certain preventive protocols, quality metrics, and best practice recommendations. A written personalized care plan for preventive services as well as general preventive health recommendations were provided to patient.   Jhonnie GORMAN Das, LPN   8/79/7973   Return in about 1 year (around 07/03/2025).  After Visit Summary: (In Person-Declined) Patient declined AVS at this time.  Nurse Notes: MAMMOGRAM ORDERED; NEEDS TDAP; APPT FOR COLONOSCOPY ON FRIDAY, BDS UTD  "

## 2024-07-05 NOTE — H&P (Signed)
 "  Clotilda Schaffer, MD [facility] [address], [address] [place], [place] [address] Phone:[phone] Fax : [phone]  Primary Care Physician:  Myrla Jon HERO, MD Primary Gastroenterologist:  Dr. Schaffer  Pre-Procedure History & Physical: HPI:  Denise Torres is a 69 y.o. female is here for an colonoscopy as surveillance of 5 SSAs in 2021.   Fhx CRC? Blood thinners? No   Past Medical History:  Diagnosis Date   Arthritis    mostly in b/l hands, left knee   Blood transfusion without reported diagnosis    Cataract    Diabetes mellitus type 2, controlled, without complications (HCC) 10/11/2015   Diabetes mellitus without complication (HCC)    Family history of breast cancer    Family history of uterine cancer    Hyperlipidemia    Lower GI bleed 09/2015    Past Surgical History:  Procedure Laterality Date   ABDOMINAL HYSTERECTOMY     COLONOSCOPY     COLONOSCOPY WITH PROPOFOL  N/A 01/04/2020   Procedure: COLONOSCOPY WITH PROPOFOL ;  Surgeon: Therisa Bi, MD;  Location: [facility] ENDOSCOPY;  Service: Gastroenterology;  Laterality: N/A;   COLONOSCOPY WITH PROPOFOL  N/A 07/29/2023   Procedure: COLONOSCOPY WITH PROPOFOL ;  Surgeon: Therisa Bi, MD;  Location: [facility] ENDOSCOPY;  Service: Gastroenterology;  Laterality: N/A;   DIAGNOSTIC LAPAROSCOPY     EYE SURGERY Bilateral 02/21/1998   lasik   LAPAROSCOPIC SUPRACERVICAL HYSTERECTOMY  2010   partial   TONSILLECTOMY  09/21/1977   WISDOM TOOTH EXTRACTION Bilateral 1976    Prior to Admission medications  Medication Sig Start Date End Date Taking? Authorizing Provider  atorvastatin  (LIPITOR) 40 MG tablet TAKE 1 TABLET DAILY 01/30/24   Bacigalupo, Angela M, MD  Blood Glucose Monitoring Suppl (ONE TOUCH ULTRA 2) w/Device KIT Use to check blood sugar in the morning, at noon, and at bedtime 01/03/24   Bacigalupo, Angela M, MD  Blood Glucose Monitoring Suppl DEVI 1 each by Does not apply route in the morning, at noon, and at bedtime. May substitute  to any manufacturer covered by patient's insurance. 12/07/23   Bacigalupo, Angela M, MD  Cholecalciferol 25 MCG (1000 UT) tablet Take 1 tablet by mouth daily.    [provider]  JARDIANCE  25 MG TABS tablet TAKE 1 TABLET DAILY BEFORE BREAKFAST 01/30/24   Bacigalupo, Jon HERO, MD  losartan  (COZAAR ) 25 MG tablet TAKE ONE-HALF (1/2) TABLET DAILY 06/04/24   Bacigalupo, Jon HERO, MD  metFORMIN  (GLUCOPHAGE ) 500 MG tablet Take 1 tablet (500 mg total) by mouth 2 (two) times daily. 02/27/24   Bacigalupo, Angela M, MD  ondansetron  (ZOFRAN ) 4 MG tablet Take 1 tablet (4 mg total) by mouth every 8 (eight) hours as needed for nausea or vomiting. 11/24/23   Bacigalupo, Jon HERO, MD  OneTouch UltraSoft 2 Lancets MISC 1 each by Does not apply route 3 (three) times daily before meals. 01/03/24   Bacigalupo, Angela M, MD  tirzepatide  (MOUNJARO ) 2.5 MG/0.5ML Pen Inject 2.5 mg into the skin once a week. 04/23/24   Bacigalupo, Angela M, MD  Vitamin Mixture (VITAMIN E COMPLETE PO) Take 1 tablet by mouth daily.    [provider]    Allergies as of 04/16/2024   (No Known Allergies)    Family History  Problem Relation Age of Onset   Breast cancer Mother 50       s/p masectomy   Diabetes Mother    Atrial fibrillation Mother    Diabetes Father    Heart attack Father 76   Stroke  Father    Rheum arthritis Brother    Alcohol abuse Brother    Drug abuse Brother    Uterine cancer Paternal Grandmother 29   Cancer Other 19       unknown type, maternal cousin's daughter    Social History   Socioeconomic History   Marital status: Married    Spouse name: Lamar   Number of children: 2   Years of education: 16   Highest education level: Bachelor's degree (e.g., BA, AB, BS)  Occupational History    Employer: [employer]  Tobacco Use   Smoking status: Every Day    Current packs/day: 0.50    Average packs/day: 0.5 packs/day for 10.0 years (5.0 ttl pk-yrs)    Types: Cigarettes   Smokeless  tobacco: Never   Tobacco comments:    started smoking around 2008  Vaping Use   Vaping status: Never Used  Substance and Sexual Activity   Alcohol use: Not Currently    Alcohol/week: 1.0 standard drink of alcohol    Types: 1 Glasses of wine per week    Comment: rarely   Drug use: Never   Sexual activity: Yes    Birth control/protection: None  Other Topics Concern   Not on file  Social History Narrative   Not on file   Social Drivers of Health   Tobacco Use: High Risk (07/03/2024)   Patient History    Smoking Tobacco Use: Every Day    Smokeless Tobacco Use: Never    Passive Exposure: Not on file  Financial Resource Strain: Low Risk (05/28/2024)   Overall Financial Resource Strain (CARDIA)    Difficulty of Paying Living Expenses: Not hard at all  Food Insecurity: No Food Insecurity (07/03/2024)   Epic    Worried About Programme Researcher, Broadcasting/film/video in the Last Year: Never true    Ran Out of Food in the Last Year: Never true  Transportation Needs: No Transportation Needs (07/03/2024)   Epic    Lack of Transportation (Medical): No    Lack of Transportation (Non-Medical): No  Physical Activity: Insufficiently Active (07/03/2024)   Exercise Vital Sign    Days of Exercise per Week: 4 days    Minutes of Exercise per Session: 30 min  Stress: No Stress Concern Present (07/03/2024)   [employer] of Occupational Health - Occupational Stress Questionnaire    Feeling of Stress: Not at all  Social Connections: Moderately Isolated (07/03/2024)   Social Connection and Isolation Panel    Frequency of Communication with Friends and Family: More than three times a week    Frequency of Social Gatherings with Friends and Family: Once a week    Attends Religious Services: Never    Database Administrator or Organizations: No    Attends Banker Meetings: Never    Marital Status: Married  Catering Manager Violence: Not At Risk (07/03/2024)   Epic    Fear of Current or Ex-Partner: No     Emotionally Abused: No    Physically Abused: No    Sexually Abused: No  Depression (PHQ2-9): Low Risk (07/03/2024)   Depression (PHQ2-9)    PHQ-2 Score: 0  Alcohol Screen: Low Risk (05/28/2024)   Alcohol Screen    Last Alcohol Screening Score (AUDIT): 1  Housing: Unknown (07/03/2024)   Epic    Unable to Pay for Housing in the Last Year: No    Number of Times Moved in the Last Year: Not on file    Homeless  in the Last Year: No  Utilities: Not At Risk (07/03/2024)   Epic    Threatened with loss of utilities: No  Health Literacy: Adequate Health Literacy (07/03/2024)   B1300 Health Literacy    Frequency of need for help with medical instructions: Never    Review of Systems: See HPI, otherwise negative ROS  Physical Exam: Ht 5' 5 (1.651 m)   Wt 53.5 kg   BMI 19.64 kg/m  CONSTITUTIONAL: Well-appearing in no acute distress.  HEENT: Pupils equal, round, Extraocular movements intact. Conjunctivae clear NECK: Neck supple CARDIOVASCULAR: Regular rate, no LE edema  RESPIRATORY: No labored breathing  ABDOMEN: Abdomen soft, nontender, not distended, no guarding, no rigidity SKIN: No apparent skin rashes or lesions. NEUROLOGIC: Normal speech, no focal findings. Mental status alert and oriented x4. PSYCHIATRIC: Mood and affect normal.   Impression/Plan: Denise Torres is here for an colonoscopy to be performed for as surveillance of 5 SSAs in 2021.  Risks, benefits, limitations, and alternatives regarding  colonoscopy have been reviewed with the patient.  Questions have been answered.  All parties agreeable.   MELANY CLOTILDA HERO, MD  07/05/2024, 3:55 PM  "

## 2024-07-06 ENCOUNTER — Ambulatory Visit: Payer: Self-pay | Admitting: Anesthesiology

## 2024-07-06 ENCOUNTER — Ambulatory Visit
Admission: RE | Admit: 2024-07-06 | Discharge: 2024-07-06 | Disposition: A | Discharge status: Home / Self Care | Attending: Gastroenterology | Admitting: Gastroenterology

## 2024-07-06 ENCOUNTER — Encounter
Admission: RE | Disposition: A | Discharge status: Home / Self Care | Payer: Self-pay | Source: Home / Self Care | Attending: Gastroenterology

## 2024-07-06 ENCOUNTER — Other Ambulatory Visit: Payer: Self-pay

## 2024-07-06 ENCOUNTER — Encounter: Payer: Self-pay | Admitting: Gastroenterology

## 2024-07-06 DIAGNOSIS — D120 Benign neoplasm of cecum: Secondary | ICD-10-CM

## 2024-07-06 DIAGNOSIS — Z860100 Personal history of colon polyps, unspecified: Secondary | ICD-10-CM

## 2024-07-06 DIAGNOSIS — Z1211 Encounter for screening for malignant neoplasm of colon: Secondary | ICD-10-CM | POA: Insufficient documentation

## 2024-07-06 DIAGNOSIS — F17200 Nicotine dependence, unspecified, uncomplicated: Secondary | ICD-10-CM | POA: Insufficient documentation

## 2024-07-06 DIAGNOSIS — E119 Type 2 diabetes mellitus without complications: Secondary | ICD-10-CM | POA: Diagnosis not present

## 2024-07-06 DIAGNOSIS — K644 Residual hemorrhoidal skin tags: Secondary | ICD-10-CM | POA: Diagnosis not present

## 2024-07-06 DIAGNOSIS — M1990 Unspecified osteoarthritis, unspecified site: Secondary | ICD-10-CM | POA: Diagnosis not present

## 2024-07-06 DIAGNOSIS — D124 Benign neoplasm of descending colon: Secondary | ICD-10-CM | POA: Diagnosis not present

## 2024-07-06 DIAGNOSIS — K635 Polyp of colon: Secondary | ICD-10-CM | POA: Insufficient documentation

## 2024-07-06 DIAGNOSIS — Z860101 Personal history of adenomatous and serrated colon polyps: Secondary | ICD-10-CM

## 2024-07-06 DIAGNOSIS — K640 First degree hemorrhoids: Secondary | ICD-10-CM | POA: Insufficient documentation

## 2024-07-06 DIAGNOSIS — D122 Benign neoplasm of ascending colon: Secondary | ICD-10-CM | POA: Diagnosis not present

## 2024-07-06 LAB — GLUCOSE, CAPILLARY: Glucose-Capillary: 132 mg/dL — ABNORMAL HIGH (ref 70–99)

## 2024-07-06 MED ORDER — PHENYLEPHRINE 80 MCG/ML (10ML) SYRINGE FOR IV PUSH (FOR BLOOD PRESSURE SUPPORT)
PREFILLED_SYRINGE | INTRAVENOUS | Status: AC
  Filled 2024-07-06: qty 10

## 2024-07-06 MED ORDER — STERILE WATER FOR IRRIGATION IR SOLN
Status: DC | PRN
  Administered 2024-07-06: 250 mL

## 2024-07-06 MED ORDER — PHENYLEPHRINE HCL (PRESSORS) 10 MG/ML IV SOLN
INTRAVENOUS | Status: DC | PRN
  Administered 2024-07-06: 80 ug via INTRAVENOUS

## 2024-07-06 MED ORDER — SODIUM CHLORIDE 0.9 % IV SOLN: INTRAVENOUS | Status: DC

## 2024-07-06 MED ORDER — PROPOFOL 10 MG/ML IV BOLUS
INTRAVENOUS | Status: DC | PRN
  Administered 2024-07-06: 20 mg via INTRAVENOUS
  Administered 2024-07-06 (×3): 30 mg via INTRAVENOUS
  Administered 2024-07-06 (×2): 20 mg via INTRAVENOUS
  Administered 2024-07-06 (×3): 30 mg via INTRAVENOUS
  Administered 2024-07-06: 80 mg via INTRAVENOUS

## 2024-07-06 MED ORDER — LACTATED RINGERS IV SOLN: INTRAVENOUS | Status: DC

## 2024-07-06 MED ORDER — ONDANSETRON HCL 4 MG/2ML IJ SOLN: 4.0000 mg | Freq: Once | INTRAMUSCULAR | Status: DC | PRN

## 2024-07-06 NOTE — Anesthesia Preprocedure Evaluation (Addendum)
"                                    Anesthesia Evaluation  Patient identified by MRN, date of birth, ID band Patient awake    Reviewed: Allergy & Precautions, NPO status , Patient's Chart, lab work & pertinent test results  History of Anesthesia Complications Negative for: history of anesthetic complications  Airway Mallampati: II   Neck ROM: Full    Dental no notable dental hx.    Pulmonary Current Smoker (1 ppd)Patient did not abstain from smoking.   Pulmonary exam normal breath sounds clear to auscultation       Cardiovascular Exercise Tolerance: Good negative cardio ROS Normal cardiovascular exam Rhythm:Regular Rate:Normal     Neuro/Psych negative neurological ROS     GI/Hepatic negative GI ROS,,,  Endo/Other  diabetes, Type 2    Renal/GU negative Renal ROS     Musculoskeletal  (+) Arthritis ,    Abdominal   Peds  Hematology negative hematology ROS (+)   Anesthesia Other Findings Last dose of Mounjaro  06/22/24.  Reproductive/Obstetrics                              Anesthesia Physical Anesthesia Plan  ASA: 2  Anesthesia Plan: General   Post-op Pain Management:    Induction: Intravenous  PONV Risk Score and Plan: 2 and Propofol  infusion, TIVA and Treatment may vary due to age or medical condition  Airway Management Planned: Natural Airway  Additional Equipment:   Intra-op Plan:   Post-operative Plan:   Informed Consent: I have reviewed the patients History and Physical, chart, labs and discussed the procedure including the risks, benefits and alternatives for the proposed anesthesia with the patient or authorized representative who has indicated his/her understanding and acceptance.       Plan Discussed with: CRNA  Anesthesia Plan Comments: (LMA/GETA backup discussed.  Patient consented for risks of anesthesia including but not limited to:  - adverse reactions to medications - damage to eyes, teeth,  lips or other oral mucosa - nerve damage due to positioning  - sore throat or hoarseness - damage to heart, brain, nerves, lungs, other parts of body or loss of life  Informed patient about role of CRNA in peri- and intra-operative care.  Patient voiced understanding.)         Anesthesia Quick Evaluation  "

## 2024-07-06 NOTE — Transfer of Care (Signed)
 Immediate Anesthesia Transfer of Care Note  Patient: Denise Torres  Procedure(s) Performed: COLONOSCOPY (Rectum) POLYPECTOMY, INTESTINE (Rectum)  Patient Location: PACU  Anesthesia Type: General  Level of Consciousness: awake, alert  and patient cooperative  Airway and Oxygen Therapy: Patient Spontanous Breathing and Patient connected to supplemental oxygen  Post-op Assessment: Post-op Vital signs reviewed, Patient's Cardiovascular Status Stable, Respiratory Function Stable, Patent Airway and No signs of Nausea or vomiting  Post-op Vital Signs: Reviewed and stable  Complications: No notable events documented.

## 2024-07-06 NOTE — Op Note (Signed)
 [facility] Patient Name: Denise Torres Procedure Date: 07/06/2024 12:16 PM MRN: [id number] Account #: [id number] Date of Birth: 01-Aug-1955 Admit Type: Outpatient Age: 69 Room: [facility] OR ROOM 01 Gender: Female Note Status: Finalized Instrument Name: Colonoscope [id number] Procedure:             Colonoscopy Indications:           High risk colon cancer surveillance: Personal history                         of colonic polyps Providers:             Clotilda Schaffer, MD Referring MD:          Jon HERO. Bacigalupo (Referring MD) Medicines:             Propofol  per Anesthesia Complications:         No immediate complications. Procedure:             Pre-Anesthesia Assessment:                        - Prior to the procedure, a History and Physical was                         performed, and patient medications and allergies were                         reviewed. The patient's tolerance of previous                         anesthesia was also reviewed. The risks and benefits                         of the procedure and the sedation options and risks                         were discussed with the patient. All questions were                         answered, and informed consent was obtained. Prior                         Anticoagulants: The patient has taken no anticoagulant                         or antiplatelet agents. ASA Grade Assessment: II - A                         patient with mild systemic disease. After reviewing                         the risks and benefits, the patient was deemed in                         satisfactory condition to undergo the procedure.                        After obtaining informed consent, the colonoscope was  passed under direct vision. Throughout the procedure,                         the patient's blood pressure, pulse, and oxygen                         saturations were monitored continuously. The                          Colonoscope was introduced through the anus and                         advanced to the the cecum, identified by appendiceal                         orifice and ileocecal valve. The colonoscopy was                         performed without difficulty. The patient tolerated                         the procedure well. The quality of the bowel                         preparation was good. The ileocecal valve, appendiceal                         orifice, and rectum were photographed. Findings:      Five sessile polyps were found in the descending colon, ascending colon       and cecum. The polyps were 3 to 6 mm in size. These polyps were removed       with a cold snare. Resection and retrieval were complete. Estimated       blood loss: none.      External and internal hemorrhoids were found. The hemorrhoids were Grade       I (internal hemorrhoids that do not prolapse). Impression:            - Five 3 to 6 mm polyps in the descending colon, in                         the ascending colon and in the cecum, removed with a                         cold snare. Resected and retrieved.                        - External and internal hemorrhoids. Recommendation:        - Patient has a contact number available for                         emergencies. The signs and symptoms of potential                         delayed complications were discussed with the patient.                         Return to normal activities tomorrow. Written  discharge instructions were provided to the patient.                        - High fiber diet.                        - Patient has a contact number available for                         emergencies. The signs and symptoms of potential                         delayed complications were discussed with the patient.                         Return to normal activities tomorrow. Written                         discharge instructions were  provided to the patient.                        - Continue present medications.                        - The findings and recommendations were discussed with                         the designated responsible adult. Procedure Code(s):     --- Professional ---                        [id number], Colonoscopy, flexible; with removal of                         tumor(s), polyp(s), or other lesion(s) by snare                         technique Diagnosis Code(s):     --- Professional ---                        Z86.010, Personal history of colonic polyps                        D12.4, Benign neoplasm of descending colon                        D12.2, Benign neoplasm of ascending colon                        D12.0, Benign neoplasm of cecum                        K64.0, First degree hemorrhoids CPT copyright 2022 [employer]. All rights reserved. The codes documented in this report are preliminary and upon coder review may  be revised to meet current compliance requirements. Clotilda Schaffer, MD 07/06/2024 12:51:17 PM Number of Addenda: 0 Note Initiated On: 07/06/2024 12:16 PM Scope Withdrawal Time: 0 hours 12 minutes 0 seconds  Total Procedure Duration: 0 hours 20 minutes 33 seconds  Estimated Blood Loss:  Estimated blood loss: none.      [place]  [facility]

## 2024-07-06 NOTE — Anesthesia Postprocedure Evaluation (Signed)
"   Anesthesia Post Note  Patient: Denise Torres  Procedure(s) Performed: COLONOSCOPY (Rectum) POLYPECTOMY, INTESTINE (Rectum)  Patient location during evaluation: PACU Anesthesia Type: General Level of consciousness: awake and alert, oriented and patient cooperative Pain management: pain level controlled Vital Signs Assessment: post-procedure vital signs reviewed and stable Respiratory status: spontaneous breathing, nonlabored ventilation and respiratory function stable Cardiovascular status: blood pressure returned to baseline and stable Postop Assessment: adequate PO intake Anesthetic complications: no   No notable events documented.   Last Vitals:  Vitals:   07/06/24 1247 07/06/24 1300  BP: 106/64 94/65  Pulse: 65 65  Resp: 13 15  Temp: (!) 36.3 C   SpO2: 97% 96%    Last Pain:  Vitals:   07/06/24 1300  TempSrc:   PainSc: 0-No pain                 Alfonso Ruths      "

## 2024-07-12 ENCOUNTER — Ambulatory Visit: Payer: Self-pay | Admitting: Gastroenterology

## 2024-07-12 LAB — SURGICAL PATHOLOGY

## 2024-07-30 ENCOUNTER — Encounter

## 2024-08-06 ENCOUNTER — Encounter

## 2024-08-30 ENCOUNTER — Ambulatory Visit: Admitting: Family Medicine

## 2024-09-06 ENCOUNTER — Ambulatory Visit: Admitting: Family Medicine

## 2025-07-09 ENCOUNTER — Ambulatory Visit
